# Patient Record
Sex: Male | Born: 1970 | Race: White | Hispanic: No | Marital: Single | Smoking: Current every day smoker
Health system: Southern US, Community
[De-identification: ages and names within clinical notes are randomized; demographics above are authoritative.]

## PROBLEM LIST (undated history)

## (undated) DIAGNOSIS — J45909 Unspecified asthma, uncomplicated: Secondary | ICD-10-CM

## (undated) DIAGNOSIS — M549 Dorsalgia, unspecified: Secondary | ICD-10-CM

## (undated) DIAGNOSIS — G8929 Other chronic pain: Secondary | ICD-10-CM

## (undated) DIAGNOSIS — F1120 Opioid dependence, uncomplicated: Secondary | ICD-10-CM

## (undated) HISTORY — PX: HAND SURGERY: SHX662

---

## 1998-05-06 ENCOUNTER — Emergency Department (HOSPITAL_COMMUNITY): Admission: EM | Admit: 1998-05-06 | Discharge: 1998-05-06 | Payer: Self-pay | Admitting: Emergency Medicine

## 1998-11-22 ENCOUNTER — Emergency Department (HOSPITAL_COMMUNITY): Admission: EM | Admit: 1998-11-22 | Discharge: 1998-11-22 | Payer: Self-pay | Admitting: Emergency Medicine

## 1998-11-22 ENCOUNTER — Encounter: Payer: Self-pay | Admitting: Emergency Medicine

## 2001-07-04 ENCOUNTER — Emergency Department (HOSPITAL_COMMUNITY): Admission: EM | Admit: 2001-07-04 | Discharge: 2001-07-04 | Payer: Self-pay

## 2001-07-04 ENCOUNTER — Encounter: Payer: Self-pay | Admitting: Emergency Medicine

## 2003-12-22 ENCOUNTER — Emergency Department (HOSPITAL_COMMUNITY): Admission: EM | Admit: 2003-12-22 | Discharge: 2003-12-22 | Payer: Self-pay | Admitting: Emergency Medicine

## 2005-12-13 ENCOUNTER — Emergency Department (HOSPITAL_COMMUNITY): Admission: EM | Admit: 2005-12-13 | Discharge: 2005-12-13 | Payer: Self-pay | Admitting: Emergency Medicine

## 2005-12-15 ENCOUNTER — Emergency Department (HOSPITAL_COMMUNITY): Admission: EM | Admit: 2005-12-15 | Discharge: 2005-12-15 | Payer: Self-pay | Admitting: Emergency Medicine

## 2005-12-18 ENCOUNTER — Emergency Department (HOSPITAL_COMMUNITY): Admission: EM | Admit: 2005-12-18 | Discharge: 2005-12-18 | Payer: Self-pay | Admitting: Emergency Medicine

## 2005-12-19 ENCOUNTER — Emergency Department (HOSPITAL_COMMUNITY): Admission: EM | Admit: 2005-12-19 | Discharge: 2005-12-19 | Payer: Self-pay | Admitting: Family Medicine

## 2005-12-21 ENCOUNTER — Emergency Department (HOSPITAL_COMMUNITY): Admission: EM | Admit: 2005-12-21 | Discharge: 2005-12-21 | Payer: Self-pay | Admitting: Emergency Medicine

## 2006-05-27 ENCOUNTER — Emergency Department (HOSPITAL_COMMUNITY): Admission: EM | Admit: 2006-05-27 | Discharge: 2006-05-27 | Payer: Self-pay | Admitting: Emergency Medicine

## 2006-05-30 ENCOUNTER — Ambulatory Visit (HOSPITAL_BASED_OUTPATIENT_CLINIC_OR_DEPARTMENT_OTHER): Admission: RE | Admit: 2006-05-30 | Discharge: 2006-05-30 | Payer: Self-pay | Admitting: *Deleted

## 2006-06-03 ENCOUNTER — Emergency Department (HOSPITAL_COMMUNITY): Admission: EM | Admit: 2006-06-03 | Discharge: 2006-06-03 | Payer: Self-pay | Admitting: Emergency Medicine

## 2006-11-24 ENCOUNTER — Emergency Department (HOSPITAL_COMMUNITY): Admission: EM | Admit: 2006-11-24 | Discharge: 2006-11-24 | Payer: Self-pay | Admitting: Emergency Medicine

## 2007-08-07 ENCOUNTER — Emergency Department (HOSPITAL_COMMUNITY): Admission: EM | Admit: 2007-08-07 | Discharge: 2007-08-07 | Payer: Self-pay | Admitting: Emergency Medicine

## 2008-03-13 ENCOUNTER — Emergency Department (HOSPITAL_COMMUNITY): Admission: EM | Admit: 2008-03-13 | Discharge: 2008-03-13 | Payer: Self-pay | Admitting: Emergency Medicine

## 2008-03-17 ENCOUNTER — Emergency Department (HOSPITAL_COMMUNITY): Admission: EM | Admit: 2008-03-17 | Discharge: 2008-03-17 | Payer: Self-pay | Admitting: Emergency Medicine

## 2009-01-03 ENCOUNTER — Emergency Department (HOSPITAL_COMMUNITY): Admission: EM | Admit: 2009-01-03 | Discharge: 2009-01-03 | Payer: Self-pay | Admitting: Emergency Medicine

## 2009-01-10 ENCOUNTER — Emergency Department (HOSPITAL_COMMUNITY): Admission: EM | Admit: 2009-01-10 | Discharge: 2009-01-10 | Payer: Self-pay | Admitting: Emergency Medicine

## 2009-04-28 ENCOUNTER — Emergency Department (HOSPITAL_COMMUNITY): Admission: EM | Admit: 2009-04-28 | Discharge: 2009-04-28 | Payer: Self-pay | Admitting: Emergency Medicine

## 2009-05-04 ENCOUNTER — Emergency Department (HOSPITAL_COMMUNITY): Admission: EM | Admit: 2009-05-04 | Discharge: 2009-05-04 | Payer: Self-pay | Admitting: Emergency Medicine

## 2009-05-18 ENCOUNTER — Emergency Department (HOSPITAL_COMMUNITY): Admission: EM | Admit: 2009-05-18 | Discharge: 2009-05-18 | Payer: Self-pay | Admitting: Emergency Medicine

## 2009-06-04 ENCOUNTER — Emergency Department (HOSPITAL_COMMUNITY): Admission: EM | Admit: 2009-06-04 | Discharge: 2009-06-04 | Payer: Self-pay | Admitting: Emergency Medicine

## 2009-08-15 ENCOUNTER — Emergency Department (HOSPITAL_COMMUNITY): Admission: EM | Admit: 2009-08-15 | Discharge: 2009-08-15 | Payer: Self-pay | Admitting: Emergency Medicine

## 2010-02-20 ENCOUNTER — Emergency Department (HOSPITAL_COMMUNITY): Admission: EM | Admit: 2010-02-20 | Discharge: 2010-02-20 | Payer: Self-pay | Admitting: Emergency Medicine

## 2010-03-31 ENCOUNTER — Emergency Department (HOSPITAL_COMMUNITY): Admission: EM | Admit: 2010-03-31 | Discharge: 2010-04-01 | Payer: Self-pay | Admitting: Emergency Medicine

## 2010-04-04 ENCOUNTER — Emergency Department (HOSPITAL_COMMUNITY): Admission: EM | Admit: 2010-04-04 | Discharge: 2010-04-04 | Payer: Self-pay | Admitting: Emergency Medicine

## 2010-05-02 ENCOUNTER — Emergency Department (HOSPITAL_COMMUNITY)
Admission: EM | Admit: 2010-05-02 | Discharge: 2010-05-02 | Payer: Self-pay | Source: Home / Self Care | Admitting: Emergency Medicine

## 2010-06-21 ENCOUNTER — Emergency Department (HOSPITAL_COMMUNITY)
Admission: EM | Admit: 2010-06-21 | Discharge: 2010-06-21 | Disposition: A | Discharge status: Home / Self Care | Payer: Self-pay | Attending: Emergency Medicine | Admitting: Emergency Medicine

## 2010-06-21 DIAGNOSIS — W230XXA Caught, crushed, jammed, or pinched between moving objects, initial encounter: Secondary | ICD-10-CM | POA: Insufficient documentation

## 2010-06-21 DIAGNOSIS — M79609 Pain in unspecified limb: Secondary | ICD-10-CM | POA: Insufficient documentation

## 2010-06-21 DIAGNOSIS — IMO0002 Reserved for concepts with insufficient information to code with codable children: Secondary | ICD-10-CM | POA: Insufficient documentation

## 2010-06-22 ENCOUNTER — Other Ambulatory Visit (HOSPITAL_COMMUNITY): Payer: Self-pay | Admitting: Emergency Medicine

## 2010-06-22 ENCOUNTER — Ambulatory Visit (HOSPITAL_COMMUNITY)
Admission: RE | Admit: 2010-06-22 | Discharge: 2010-06-22 | Disposition: A | Discharge status: Home / Self Care | Payer: Self-pay | Source: Ambulatory Visit | Attending: Emergency Medicine | Admitting: Emergency Medicine

## 2010-06-22 DIAGNOSIS — R52 Pain, unspecified: Secondary | ICD-10-CM | POA: Insufficient documentation

## 2010-08-22 ENCOUNTER — Emergency Department (HOSPITAL_COMMUNITY)
Admission: EM | Admit: 2010-08-22 | Discharge: 2010-08-22 | Disposition: A | Discharge status: Home / Self Care | Payer: Worker's Compensation | Attending: Emergency Medicine | Admitting: Emergency Medicine

## 2010-08-22 ENCOUNTER — Emergency Department (HOSPITAL_COMMUNITY): Payer: Worker's Compensation

## 2010-08-22 DIAGNOSIS — Y929 Unspecified place or not applicable: Secondary | ICD-10-CM | POA: Insufficient documentation

## 2010-08-22 DIAGNOSIS — S8010XA Contusion of unspecified lower leg, initial encounter: Secondary | ICD-10-CM | POA: Insufficient documentation

## 2010-08-22 DIAGNOSIS — W12XXXA Fall on and from scaffolding, initial encounter: Secondary | ICD-10-CM | POA: Insufficient documentation

## 2010-08-22 DIAGNOSIS — M25569 Pain in unspecified knee: Secondary | ICD-10-CM | POA: Insufficient documentation

## 2010-08-22 DIAGNOSIS — IMO0002 Reserved for concepts with insufficient information to code with codable children: Secondary | ICD-10-CM | POA: Insufficient documentation

## 2010-08-22 DIAGNOSIS — M79609 Pain in unspecified limb: Secondary | ICD-10-CM | POA: Insufficient documentation

## 2010-08-22 DIAGNOSIS — Z7982 Long term (current) use of aspirin: Secondary | ICD-10-CM | POA: Insufficient documentation

## 2010-10-07 NOTE — Op Note (Signed)
NAMEVERLAN, GROTZ            ACCOUNT NO.:  [id number]   MEDICAL RECORD NO.:  [id number]          PATIENT TYPE:  AMB   LOCATION:  DSC                          FACILITY:  [facility]   PHYSICIAN:  Tennis Must Meyerdierks, M.D.DATE OF BIRTH:  [birth year]   DATE OF PROCEDURE:  05/30/2006  DATE OF DISCHARGE:                               OPERATIVE REPORT   PREOPERATIVE DIAGNOSIS:  Proximal phalanx fracture, right small finger.   POSTOPERATIVE DIAGNOSIS:  Proximal phalanx fracture, right small finger.   PROCEDURE:  Open reduction internal fixation proximal phalanx, right  small finger.   SURGEON:  Lowell Bouton, M.D.   ANESTHESIA:  General.   OPERATIVE FINDINGS:  The patient had a comminuted oblique fracture of  the proximal phalanx that was ulnarly deviated.   PROCEDURE:  Under general anesthesia with a tourniquet on the right arm,  the right hand was prepped and draped in usual fashion.  After  exsanguinating the limb the tourniquet was inflated to 250 mmHg.  A  longitudinal incision was made over the dorsum of the proximal phalanx  and carried through the subcutaneous tissues.  Bleeding points were  coagulated.  The extensor tendon was then divided longitudinally and  Freer elevator was used to identify the fracture site.  The fracture was  cleaned with a curette and irrigated and reduced.  It was held  temporarily with a 0.035 K-wire from dorsal to volar.  Rotation appeared  normal clinically.  A five hole plate was then applied from the modular  handset using a 1.5 mm plate.  The distal hole was cut off because of  the proximity to the PIP joint.  The plate was then inserted using 1.5-  mm screws and a 1.3-mm screw.  X-rays showed good position of the  fracture and good length of the screws.  The wound was then irrigated  copiously with saline.  The extensor mechanism was repaired with a 4-0  Mersilene the skin with a 3-0 subcuticular Prolene.  Steri-Strips were  applied.  Half percent Marcaine digital block was performed.  The  patient was placed in a dorsal protective splint.  He tolerated the  procedure well and a tourniquet was released with good circulation of  the hand.  He went to the recovery room awake in stable condition.      Lowell Bouton, M.D.  Electronically Signed     EMM/MEDQ  D:  05/30/2006  T:  05/30/2006  Job:  [id number]

## 2010-12-14 ENCOUNTER — Emergency Department (HOSPITAL_COMMUNITY): Payer: Self-pay

## 2010-12-14 ENCOUNTER — Emergency Department (HOSPITAL_COMMUNITY)
Admission: EM | Admit: 2010-12-14 | Discharge: 2010-12-14 | Disposition: A | Discharge status: Home / Self Care | Payer: Self-pay | Attending: Emergency Medicine | Admitting: Emergency Medicine

## 2010-12-14 DIAGNOSIS — S6990XA Unspecified injury of unspecified wrist, hand and finger(s), initial encounter: Secondary | ICD-10-CM | POA: Insufficient documentation

## 2010-12-14 DIAGNOSIS — Z981 Arthrodesis status: Secondary | ICD-10-CM | POA: Insufficient documentation

## 2010-12-14 DIAGNOSIS — W230XXA Caught, crushed, jammed, or pinched between moving objects, initial encounter: Secondary | ICD-10-CM | POA: Insufficient documentation

## 2010-12-14 DIAGNOSIS — S6980XA Other specified injuries of unspecified wrist, hand and finger(s), initial encounter: Secondary | ICD-10-CM | POA: Insufficient documentation

## 2010-12-14 DIAGNOSIS — M79609 Pain in unspecified limb: Secondary | ICD-10-CM | POA: Insufficient documentation

## 2011-03-23 IMAGING — CR DG KNEE COMPLETE 4+V*L*
5 series · 5 of 5 positions shown · non-contrast
Comparison: None.

CLINICAL DATA: Left knee pain.

LEFT KNEE - COMPLETE 4+ VIEW

[t knee ap left]
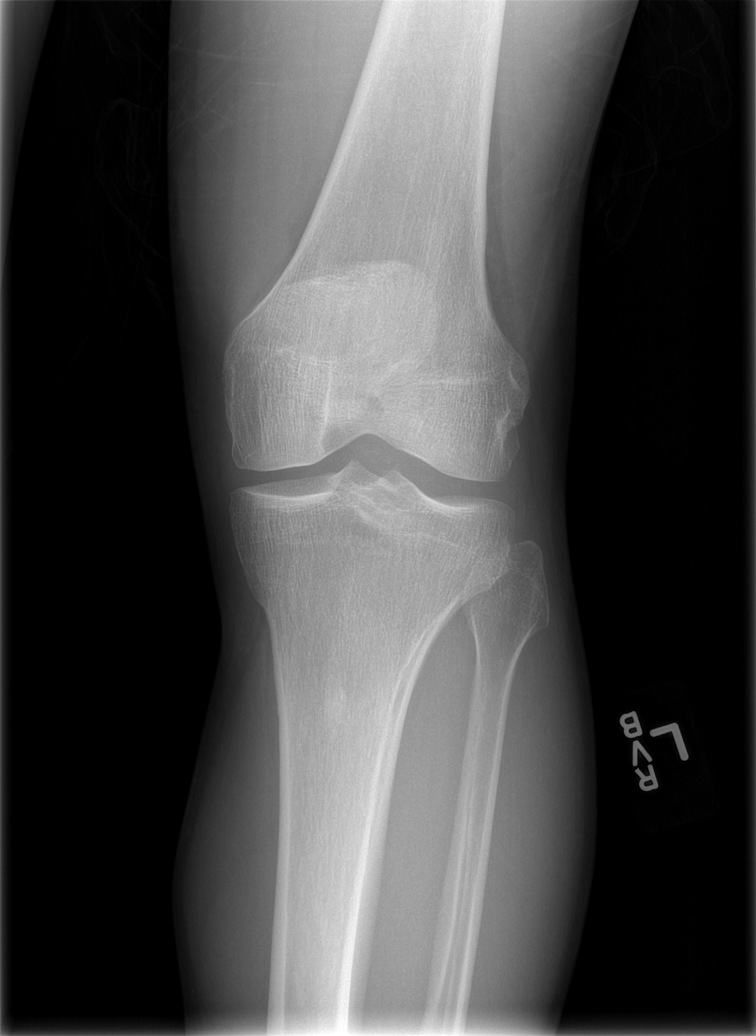

[t knee oblique left (1 of 2)]
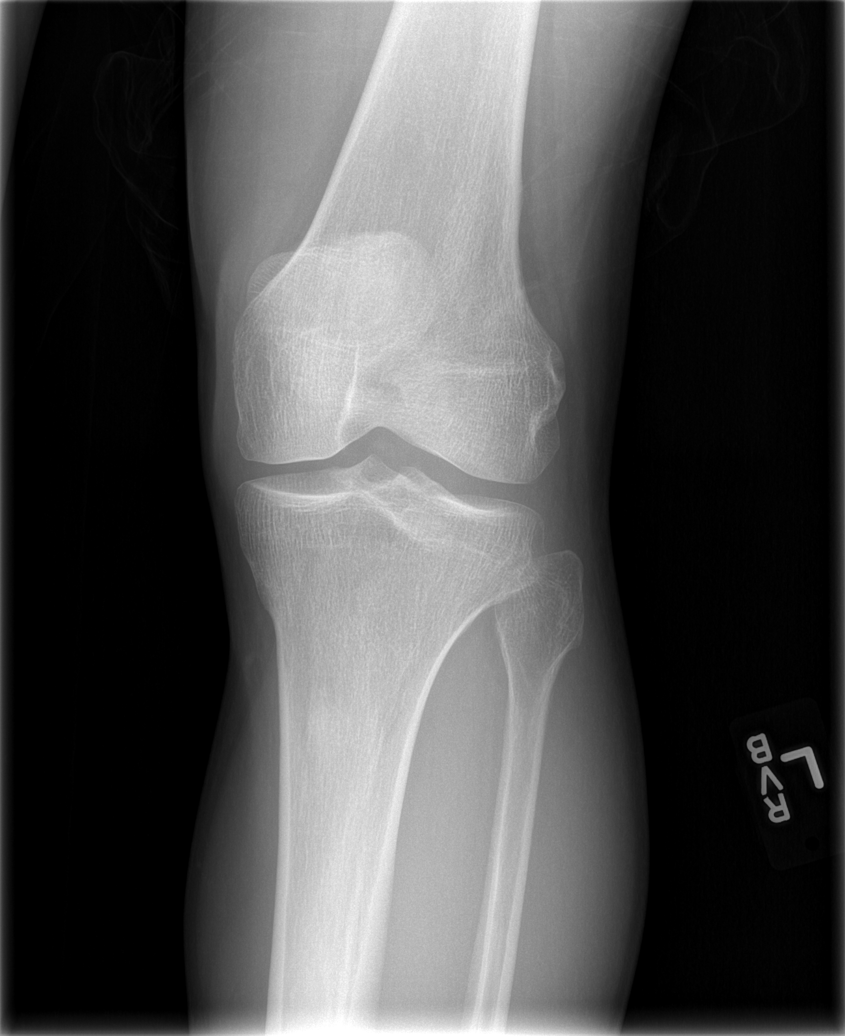

[t knee oblique left (2 of 2)]
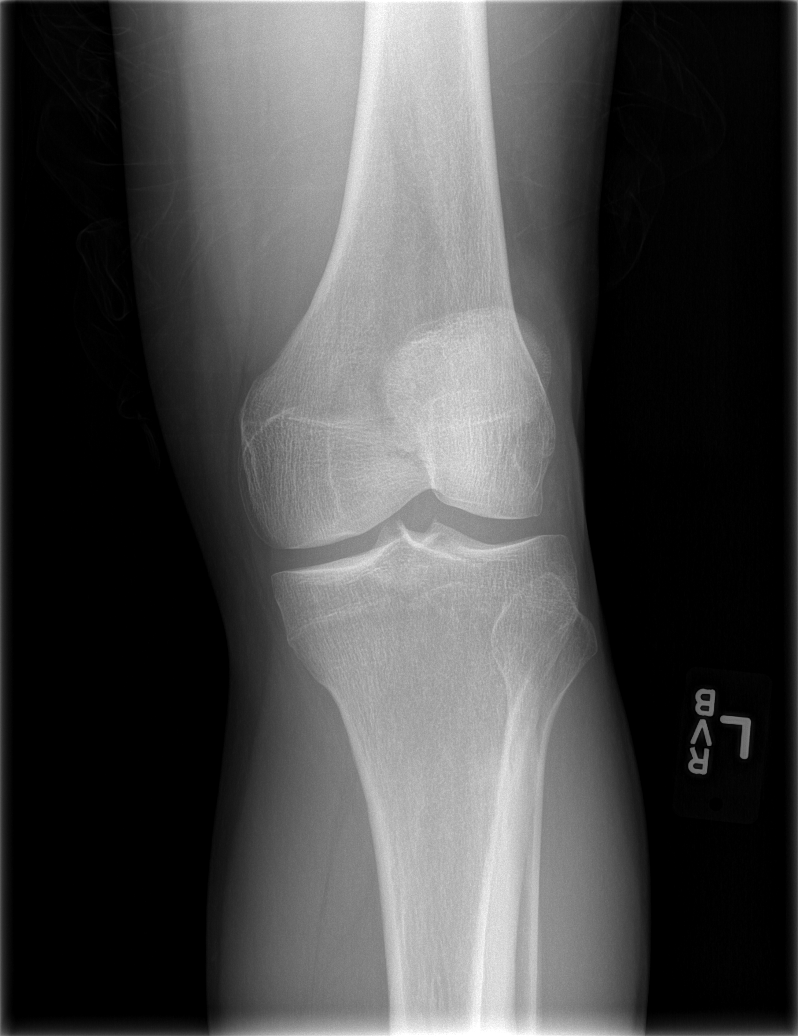

[t knee lat left (1 of 2)]
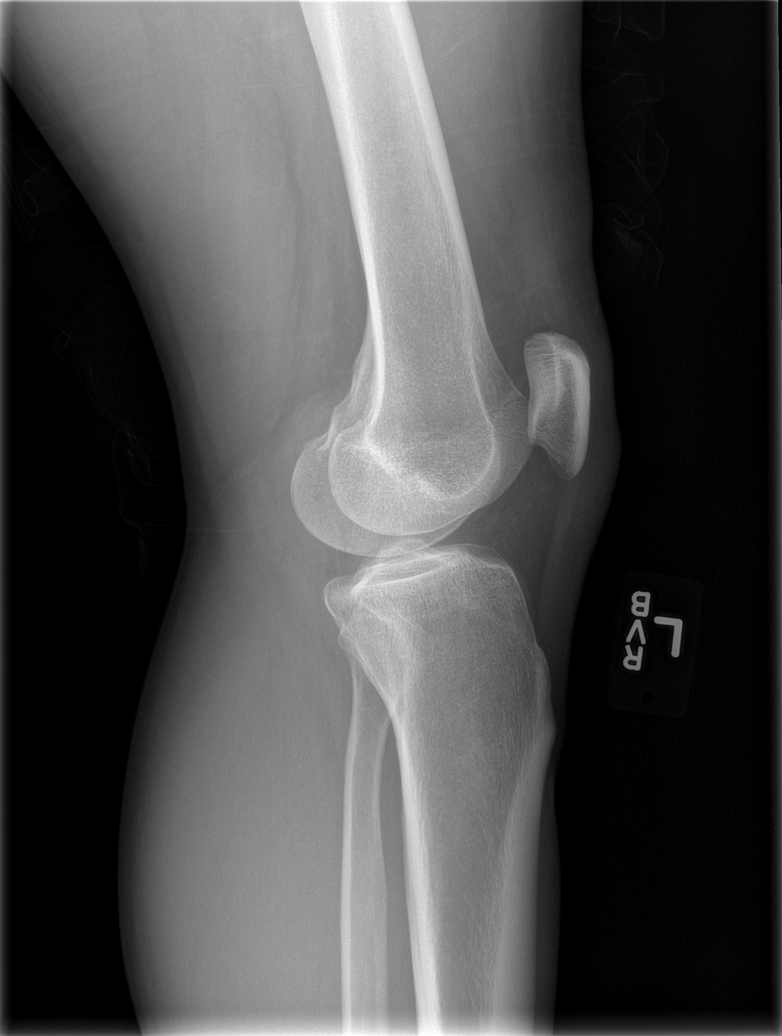

[t knee lat left (2 of 2)]
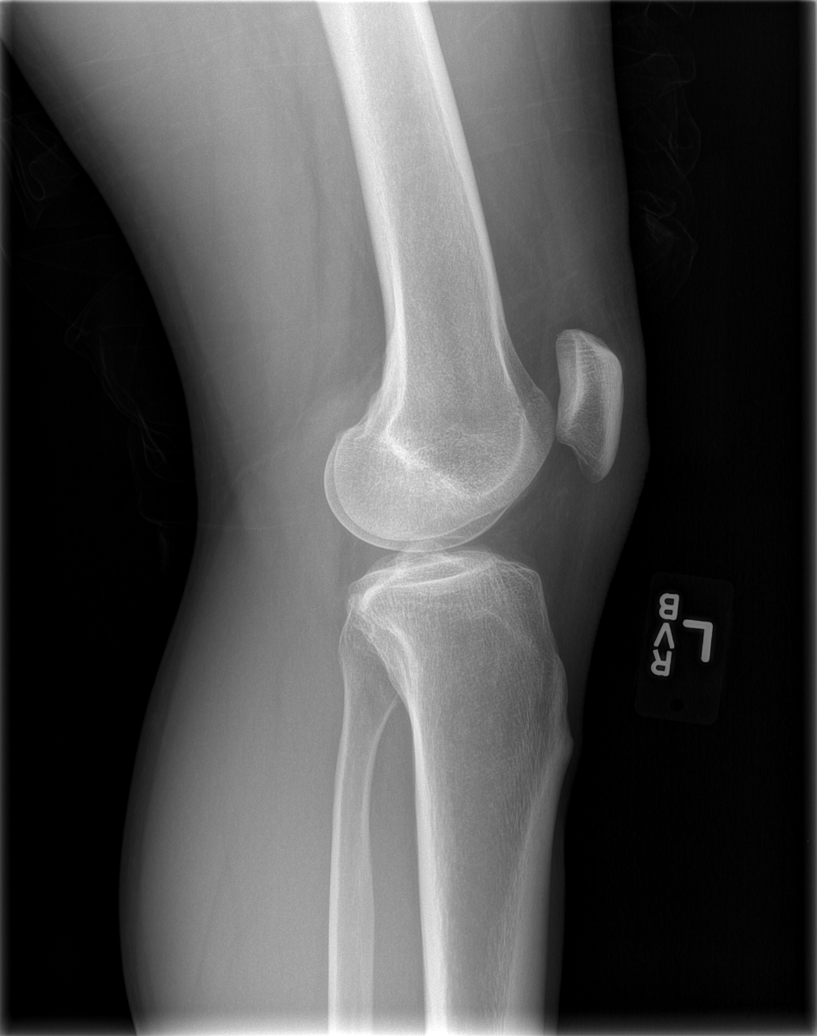

[5 of 5 positions shown; findings below may reference images not displayed]

FINDINGS: Imaged bones, joints and soft tissues appear normal.
IMPRESSION: Negative exam.

## 2011-05-27 ENCOUNTER — Emergency Department (HOSPITAL_COMMUNITY)
Admission: EM | Admit: 2011-05-27 | Discharge: 2011-05-27 | Disposition: A | Discharge status: Home / Self Care | Payer: Self-pay | Attending: Emergency Medicine | Admitting: Emergency Medicine

## 2011-05-27 DIAGNOSIS — L600 Ingrowing nail: Secondary | ICD-10-CM | POA: Insufficient documentation

## 2011-05-27 DIAGNOSIS — B351 Tinea unguium: Secondary | ICD-10-CM | POA: Insufficient documentation

## 2011-05-27 DIAGNOSIS — F17200 Nicotine dependence, unspecified, uncomplicated: Secondary | ICD-10-CM | POA: Insufficient documentation

## 2011-05-27 DIAGNOSIS — M7989 Other specified soft tissue disorders: Secondary | ICD-10-CM | POA: Insufficient documentation

## 2011-05-27 DIAGNOSIS — M79609 Pain in unspecified limb: Secondary | ICD-10-CM | POA: Insufficient documentation

## 2011-05-27 MED ORDER — BUPIVACAINE HCL (PF) 0.5 % IJ SOLN
10.0000 mL | Freq: Once | INTRAMUSCULAR | Status: DC
  Filled 2011-05-27: qty 10

## 2011-05-27 MED ORDER — CEPHALEXIN 500 MG PO CAPS: 500.0000 mg | ORAL_CAPSULE | Freq: Four times a day (QID) | ORAL | Status: AC

## 2011-05-27 MED ORDER — OXYCODONE-ACETAMINOPHEN 5-325 MG PO TABS: 1.0000 | ORAL_TABLET | ORAL | Status: AC | PRN

## 2011-05-27 NOTE — ED Provider Notes (Signed)
Medical screening examination/treatment/procedure(s) were performed by non-physician practitioner and as supervising physician I was immediately available for consultation/collaboration.  Donnetta Hutching, MD 05/27/11 2308

## 2011-05-27 NOTE — ED Notes (Signed)
Lt. Great toe is infected

## 2011-05-27 NOTE — ED Provider Notes (Signed)
History     CSN: [id number]  Arrival date & time 05/27/11  1246   First MD Initiated Contact with Patient 05/27/11 1550      Chief Complaint  Patient presents with  . Toe Pain    (Consider location/radiation/quality/duration/timing/severity/associated sxs/prior treatment) Patient is a 41 y.o. male presenting with toe pain. The history is provided by the patient.  Toe Pain This is a new problem. The current episode started in the past 7 days. The problem occurs constantly. The problem has been gradually worsening. Pertinent negatives include no chills, fever or weakness. The symptoms are aggravated by standing, stress and walking. He has tried nothing for the symptoms.  Pt states he thinks he has an ingrown toe nail. Hx of the same before.States had to have part of his nail removed in the past. Stats also has hardening and yellowing of that nail. Denies fever, chills,malaise, redness of the toe, drainage, injury.  History reviewed. No pertinent past medical history.  History reviewed. No pertinent past surgical history.  No family history on file.  History  Substance Use Topics  . Smoking status: Current Everyday Smoker -- 0.5 packs/day    Types: Cigarettes  . Smokeless tobacco: Never Used  . Alcohol Use: No      Review of Systems  Constitutional: Negative for fever and chills.  HENT: Negative.   Eyes: Negative.   Respiratory: Negative.   Cardiovascular: Negative.   Gastrointestinal: Negative.   Genitourinary: Negative.   Musculoskeletal:       Toe pain  Skin: Negative.   Neurological: Negative.  Negative for weakness.  Psychiatric/Behavioral: Negative.     Allergies  Hydrocodone  Home Medications  No current outpatient prescriptions on file.  BP 116/77  Pulse 66  Temp(Src) 98.1 F (36.7 C) (Oral)  Resp 20  Ht 5\' 11"  (1.803 m)  Wt 196 lb (88.905 kg)  BMI 27.34 kg/m2  SpO2 98%  Physical Exam  Nursing note and vitals reviewed. Constitutional: He is  oriented to person, place, and time. He appears well-developed and well-nourished. No distress.  HENT:  Head: Normocephalic and atraumatic.  Eyes: Conjunctivae are normal.  Neck: Neck supple.  Cardiovascular: Normal rate, regular rhythm and normal heart sounds.   Pulmonary/Chest: Effort normal and breath sounds normal. No respiratory distress.  Musculoskeletal: Normal range of motion. He exhibits tenderness.       Left great toe ingrown to nail. Tissue surrounding left nail swollen, tender. There is onychomycosis of the toe nail  Neurological: He is alert and oriented to person, place, and time.  Skin: Skin is warm and dry.  Psychiatric: He has a normal mood and affect.    ED Course  Excise ingrown toenail Date/Time: 05/27/2011 4:51 PM Performed by: Jaynie Crumble A Authorized by: Jaynie Crumble A Consent: Verbal consent obtained. Written consent not obtained. Consent given by: patient Patient understanding: patient states understanding of the procedure being performed Patient consent: the patient's understanding of the procedure matches consent given Procedure consent: procedure consent matches procedure scheduled Required items: required blood products, implants, devices, and special equipment available Patient identity confirmed: verbally with patient and arm band Time out: Immediately prior to procedure a "time out" was called to verify the correct patient, procedure, equipment, support staff and site/side marked as required. Preparation: Patient was prepped and draped in the usual sterile fashion. Local anesthesia used: yes Anesthesia: digital block Local anesthetic: bupivacaine 0.5% with epinephrine Anesthetic total: 4 ml Patient sedated: no Patient tolerance: Patient tolerated the procedure  well with no immediate complications.   (including critical care time)  Half of the nail that is ingrown excised. Will d/c home with podiatry follow up.   MDM          Lottie Mussel, PA 05/27/11 1653  Lottie Mussel, PA 05/27/11 1655

## 2012-02-21 ENCOUNTER — Emergency Department (HOSPITAL_COMMUNITY): Payer: Self-pay

## 2012-02-21 ENCOUNTER — Emergency Department (HOSPITAL_COMMUNITY)
Admission: EM | Admit: 2012-02-21 | Discharge: 2012-02-21 | Disposition: A | Discharge status: Home / Self Care | Payer: Self-pay | Attending: Emergency Medicine | Admitting: Emergency Medicine

## 2012-02-21 ENCOUNTER — Encounter (HOSPITAL_COMMUNITY): Payer: Self-pay | Admitting: Emergency Medicine

## 2012-02-21 DIAGNOSIS — M79643 Pain in unspecified hand: Secondary | ICD-10-CM

## 2012-02-21 DIAGNOSIS — S6990XA Unspecified injury of unspecified wrist, hand and finger(s), initial encounter: Secondary | ICD-10-CM | POA: Insufficient documentation

## 2012-02-21 DIAGNOSIS — S62309A Unspecified fracture of unspecified metacarpal bone, initial encounter for closed fracture: Secondary | ICD-10-CM | POA: Insufficient documentation

## 2012-02-21 DIAGNOSIS — W208XXA Other cause of strike by thrown, projected or falling object, initial encounter: Secondary | ICD-10-CM | POA: Insufficient documentation

## 2012-02-21 DIAGNOSIS — S62339A Displaced fracture of neck of unspecified metacarpal bone, initial encounter for closed fracture: Secondary | ICD-10-CM

## 2012-02-21 HISTORY — DX: Unspecified asthma, uncomplicated: J45.909

## 2012-02-21 MED ORDER — OXYCODONE-ACETAMINOPHEN 5-325 MG PO TABS: 2.0000 | ORAL_TABLET | ORAL | Status: DC | PRN

## 2012-02-21 MED ORDER — OXYCODONE-ACETAMINOPHEN 5-325 MG PO TABS
2.0000 | ORAL_TABLET | Freq: Once | ORAL | Status: AC
  Administered 2012-02-21: 2 via ORAL
  Filled 2012-02-21: qty 2
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
## 2012-02-21 NOTE — ED Provider Notes (Signed)
Medical screening examination/treatment/procedure(s) were performed by non-physician practitioner and as supervising physician I was immediately available for consultation/collaboration.   Benny Lennert, MD 02/21/12 1346

## 2012-02-21 NOTE — ED Notes (Signed)
Pt reports blunt trauma to r/hand. Outer aspect of hand swollen , with decreased ROM. Hx of injury to same area

## 2012-02-21 NOTE — ED Provider Notes (Signed)
History     CSN: [id number]  Arrival date & time 02/21/12  1014   First MD Initiated Contact with Patient 02/21/12 1033      Chief Complaint  Patient presents with  . Hand Injury    r/hand swollen 18 hrs post trauma    (Consider location/radiation/quality/duration/timing/severity/associated sxs/prior treatment) The history is provided by the patient and medical records.    Jesus Glass is a 41 y.o. male presents to the emergency department complaining of right hand pain.  The onset of the symptoms was  abrupt starting 16 hours ago.  The patient has associated swelling and ecchymosis.  The symptoms have been  persistent, gradually worsened.  Nothing makes the symptoms worse and nothing makes symptoms better.  The patient denies fever, chills, headache, neck pain, back pain, chest pain, shortness of breath, abdominal pain, nausea, vomiting, diarrhea, weakness, dizziness, loss of sensation.  Patient states he was moving a 500 lb door when it dropped on his right hand causing injury.  Patient states the pain, swelling and bruising have increased throughout the night. Patient states he's been taking Goody powders without pain relief.  Patient has a history of fracture on the right hand to the proximal phalanx of the pinky finger which was surgically repaired 2 years ago.   Past Medical History  Diagnosis Date  . Asthma     Past Surgical History  Procedure Date  . Hand surgery     No family history on file.  History  Substance Use Topics  . Smoking status: Current Every Day Smoker -- 0.5 packs/day    Types: Cigarettes  . Smokeless tobacco: Never Used  . Alcohol Use: No      Review of Systems  Musculoskeletal: Positive for joint swelling and arthralgias. Negative for gait problem.  Skin: Negative for wound.  Neurological: Negative for numbness.  All other systems reviewed and are negative.    Allergies  Hydrocodone  Home Medications   Current Outpatient Rx    Name Route Sig Dispense Refill  . OXYCODONE-ACETAMINOPHEN 5-325 MG PO TABS Oral Take 2 tablets by mouth every 4 (four) hours as needed for pain. 12 tablet 0    BP 119/87  Pulse 99  Temp 98.5 F (36.9 C) (Oral)  Resp 18  Wt 185 lb (83.915 kg)  SpO2 99%  Physical Exam  Nursing note and vitals reviewed. Constitutional: He appears well-developed and well-nourished. No distress.  HENT:  Head: Normocephalic and atraumatic.  Eyes: Conjunctivae normal are normal.  Cardiovascular: Normal rate, regular rhythm, normal heart sounds and intact distal pulses.  Exam reveals no gallop and no friction rub.   No murmur heard.      Capillary refill less than 3 seconds  Pulmonary/Chest: Effort normal and breath sounds normal.  Musculoskeletal: He exhibits edema and tenderness.       ROM: Decreased in the right pinky and fourth fingers. Strong 5/5 grip strength with the first 3 digits of the right hand. Pain to palpation the fifth metacarpal  Neurological: He is alert. Coordination normal.       Sensation intact to sharp and dull touch in the first through fourth digits; patient states touch in the pinky finger feels tingly but he is able to feel it. Strength 5/5 in the first 3 digits of the right hand; decreased in the ring and pinky fingers.  Skin: Skin is warm and dry. He is not diaphoretic.       Ecchymosis and breathing on the medial  aspect of the dorsal aspect of the hand.    ED Course  Procedures (including critical care time)  Labs Reviewed - No data to display Dg Hand Complete Right  02/21/2012  *RADIOLOGY REPORT*  Clinical Data: Hand injury.  Fall.  RIGHT HAND - COMPLETE 3+ VIEW  Comparison: 12/14/2010  Findings: Postoperative changes again noted within the proximal fifth phalanx.  There is a fracture through the distal aspect of the right fifth metacarpal with mild angulation.  Overlying soft tissue swelling.  IMPRESSION: Distal right fifth metacarpal fracture.   Original Report  Authenticated By: Cyndie Chime, M.D.      1. Boxers fracture   2. Hand pain       MDM  [name] presents emergency department with right hand pain.  Concern for boxer's fracture.  X-ray with distal right fifth metacarpal fracture.  Patient splinted in the emergency department and pain controled.  I will have the patient followup with hand surgery today or tomorrow.  Discussed these findings with the patient.  I have also discussed reasons to return immediately to the ER.  Patient expresses understanding and agrees with plan.  1. Medications: Percocet 2. Treatment: Rest, ice, compression, elevation, Motrin/ibuprofen for pain control, Percocet for breakthrough pain 3. Follow Up: With hand surgery this afternoon or tomorrow.         Dahlia Client Kodi Steil, PA-C 02/21/12 1250

## 2012-03-02 ENCOUNTER — Encounter (HOSPITAL_COMMUNITY): Payer: Self-pay | Admitting: Emergency Medicine

## 2012-03-02 ENCOUNTER — Emergency Department (HOSPITAL_COMMUNITY)
Admission: EM | Admit: 2012-03-02 | Discharge: 2012-03-02 | Disposition: A | Discharge status: Home / Self Care | Payer: Self-pay | Attending: Emergency Medicine | Admitting: Emergency Medicine

## 2012-03-02 ENCOUNTER — Emergency Department (HOSPITAL_COMMUNITY): Payer: Self-pay

## 2012-03-02 DIAGNOSIS — M79609 Pain in unspecified limb: Secondary | ICD-10-CM | POA: Insufficient documentation

## 2012-03-02 DIAGNOSIS — F17200 Nicotine dependence, unspecified, uncomplicated: Secondary | ICD-10-CM | POA: Insufficient documentation

## 2012-03-02 DIAGNOSIS — J45909 Unspecified asthma, uncomplicated: Secondary | ICD-10-CM | POA: Insufficient documentation

## 2012-03-02 DIAGNOSIS — S62339A Displaced fracture of neck of unspecified metacarpal bone, initial encounter for closed fracture: Secondary | ICD-10-CM

## 2012-03-02 DIAGNOSIS — IMO0001 Reserved for inherently not codable concepts without codable children: Secondary | ICD-10-CM | POA: Insufficient documentation

## 2012-03-02 MED ORDER — MELOXICAM 15 MG PO TABS: 15.0000 mg | ORAL_TABLET | Freq: Every day | ORAL | Status: DC
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
## 2012-03-02 NOTE — ED Notes (Signed)
Pt dc's w/ written instructions, pt verbalized understanding

## 2012-03-02 NOTE — ED Provider Notes (Signed)
Medical screening examination/treatment/procedure(s) were performed by non-physician practitioner and as supervising physician I was immediately available for consultation/collaboration.  John-Adam Margarethe Virgen, M.D.     John-Adam Mahaila Tischer, MD 03/02/12 2310 

## 2012-03-02 NOTE — ED Notes (Signed)
Pt seen here Oct 2 w/ dx of boxer's fractor to right hand. Pt indicates he was discharged w/ f/u to Dr. Lajoyce Corners who was to operate on pt.  Pt has not had surgery, the device provided pt in ortho's office is to small for the pt's hand so pt has continued to wear wrist immbolizer. Pt presents w/ new worsening pain in right hand, worked yesterday and thinks he may have re-injured his hand yesterday.

## 2012-03-02 NOTE — Discharge Instructions (Signed)
Elevate your hand. Avoid using it.  Keep the brace on. Ice. Follow up with Dr. Lajoyce Corners for further evaluation and treatment. Mobic for pain and inflammation.   Boxer's Fracture You have a break (fracture) of the fifth metacarpal bone. This is commonly called a boxer's fracture. This is the bone in the hand where the little finger attaches. The fracture is in the end of that bone, closest to the little finger. It is usually caused when you hit an object with a clenched fist. Often, the knuckle is pushed down by the impact. Sometimes, the fracture rotates out of position. A boxer's fracture will usually heal within 6 weeks, if it is treated properly and protected from re-injury. Surgery is sometimes needed. A cast, splint, or bulky hand dressing may be used to protect and immobilize a boxer's fracture. Do not remove this device or dressing until your caregiver approves. Keep your hand elevated, and apply ice packs for 15 to 20 minutes every 2 hours, for the first 2 days. Elevation and ice help reduce swelling and relieve pain. See your caregiver, or an orthopedic specialist, for follow-up care within the next 10 days. This is to make sure your fracture is healing properly. Document Released: 05/08/2005 Document Revised: 07/31/2011 Document Reviewed: 10/26/2006 [facility] Patient Information 2013 [place], [place].

## 2012-05-09 ENCOUNTER — Emergency Department (HOSPITAL_COMMUNITY): Payer: Self-pay

## 2012-05-09 ENCOUNTER — Encounter (HOSPITAL_COMMUNITY): Payer: Self-pay | Admitting: Emergency Medicine

## 2012-05-09 ENCOUNTER — Emergency Department (HOSPITAL_COMMUNITY)
Admission: EM | Admit: 2012-05-09 | Discharge: 2012-05-09 | Disposition: A | Discharge status: Home / Self Care | Payer: Self-pay | Attending: Emergency Medicine | Admitting: Emergency Medicine

## 2012-05-09 DIAGNOSIS — F17200 Nicotine dependence, unspecified, uncomplicated: Secondary | ICD-10-CM | POA: Insufficient documentation

## 2012-05-09 DIAGNOSIS — X58XXXA Exposure to other specified factors, initial encounter: Secondary | ICD-10-CM | POA: Insufficient documentation

## 2012-05-09 DIAGNOSIS — Z23 Encounter for immunization: Secondary | ICD-10-CM | POA: Insufficient documentation

## 2012-05-09 DIAGNOSIS — S6990XA Unspecified injury of unspecified wrist, hand and finger(s), initial encounter: Secondary | ICD-10-CM | POA: Insufficient documentation

## 2012-05-09 DIAGNOSIS — S6991XA Unspecified injury of right wrist, hand and finger(s), initial encounter: Secondary | ICD-10-CM

## 2012-05-09 DIAGNOSIS — Y9389 Activity, other specified: Secondary | ICD-10-CM | POA: Insufficient documentation

## 2012-05-09 DIAGNOSIS — Y9289 Other specified places as the place of occurrence of the external cause: Secondary | ICD-10-CM | POA: Insufficient documentation

## 2012-05-09 DIAGNOSIS — J45909 Unspecified asthma, uncomplicated: Secondary | ICD-10-CM | POA: Insufficient documentation

## 2012-05-09 MED ORDER — CEPHALEXIN 500 MG PO CAPS: 500.0000 mg | ORAL_CAPSULE | Freq: Four times a day (QID) | ORAL | Status: DC

## 2012-05-09 MED ORDER — OXYCODONE-ACETAMINOPHEN 5-325 MG PO TABS
1.0000 | ORAL_TABLET | Freq: Once | ORAL | Status: AC
  Administered 2012-05-09: 1 via ORAL
  Filled 2012-05-09: qty 1

## 2012-05-09 MED ORDER — OXYCODONE-ACETAMINOPHEN 5-325 MG PO TABS: 1.0000 | ORAL_TABLET | ORAL | Status: DC | PRN

## 2012-05-09 MED ORDER — TETANUS-DIPHTH-ACELL PERTUSSIS 5-2.5-18.5 LF-MCG/0.5 IM SUSP
0.5000 mL | Freq: Once | INTRAMUSCULAR | Status: AC
  Administered 2012-05-09: 0.5 mL via INTRAMUSCULAR
  Filled 2012-05-09: qty 0.5

## 2012-05-09 NOTE — ED Provider Notes (Signed)
Medical screening examination/treatment/procedure(s) were performed by non-physician practitioner and as supervising physician I was immediately available for consultation/collaboration.  Flint Melter, MD 05/09/12 [phone]

## 2012-05-09 NOTE — Progress Notes (Signed)
On 05/09/12 during [facility] ED visit pt was noted to be listed with no insurance coverage CM and [facility] liaison spoke with pt.  Pt offered services to assist with finding a [place] self pay provider & health reform information  

## 2012-05-09 NOTE — ED Provider Notes (Signed)
History     CSN: [id number]  Arrival date & time 05/09/12  1158   First MD Initiated Contact with Patient 05/09/12 1239      Chief Complaint  Patient presents with  . Foreign Body in Skin    (Consider location/radiation/quality/duration/timing/severity/associated sxs/prior treatment) Patient is a 41 y.o. male presenting with hand injury. The history is provided by the patient.  Hand Injury  The incident occurred 3 to 5 hours ago. The incident occurred at work. The pain is present in the right hand. The pain is moderate. Pertinent negatives include no fever. Associated symptoms comments: Working with a wooden handled shovel while working this morning and a large splinter broke off in his right hand. He has had increasing pain since this morning along with moderate swelling..    Past Medical History  Diagnosis Date  . Asthma     Past Surgical History  Procedure Date  . Hand surgery     History reviewed. No pertinent family history.  History  Substance Use Topics  . Smoking status: Current Every Day Smoker -- 0.5 packs/day    Types: Cigarettes  . Smokeless tobacco: Never Used  . Alcohol Use: No      Review of Systems  Constitutional: Negative for fever and chills.  Musculoskeletal:       See HPI  Skin: Negative.   Neurological: Negative.  Negative for numbness.    Allergies  Hydrocodone  Home Medications   Current Outpatient Rx  Name  Route  Sig  Dispense  Refill  . ACETAMINOPHEN 500 MG PO TABS   Oral   Take 1,000 mg by mouth every 6 (six) hours as needed. Pain           BP 119/75  Pulse 84  Temp 98 F (36.7 C) (Oral)  Resp 18  SpO2 99%  Physical Exam  Constitutional: He is oriented to person, place, and time. He appears well-developed and well-nourished.  Neck: Normal range of motion.  Pulmonary/Chest: Effort normal.  Musculoskeletal: Normal range of motion.       Right moderately swollen at hypothenar surface. Puncture wound at thumb base  overlying palmar aspect 1st MCP. No active drainage or bleeding. ROM limited by discomfort. Full strength of first digit.   Neurological: He is alert and oriented to person, place, and time.  Skin: Skin is warm and dry.  Psychiatric: He has a normal mood and affect.    ED Course  Procedures (including critical care time)  Labs Reviewed - No data to display No results found.   No diagnosis found. 1. FB right hand   MDM  The patient has connection with Dr. Aldean Baker and makes an appointment for follow up prior to discharge. Abx started for FB, baseline x-ray taken. Pain addressed.        Rodena Medin, PA-C 05/09/12 1440

## 2012-05-09 NOTE — ED Notes (Signed)
Pt states he got a wood splinter in his right hand this am, pt tried to pull it out but some was left in hand. Pt now with pain and swelling to right palm.

## 2012-07-08 ENCOUNTER — Emergency Department (HOSPITAL_COMMUNITY)
Admission: EM | Admit: 2012-07-08 | Discharge: 2012-07-08 | Disposition: A | Discharge status: Home / Self Care | Payer: Self-pay | Attending: Emergency Medicine | Admitting: Emergency Medicine

## 2012-07-08 ENCOUNTER — Emergency Department (HOSPITAL_COMMUNITY): Payer: Self-pay

## 2012-07-08 ENCOUNTER — Encounter (HOSPITAL_COMMUNITY): Payer: Self-pay | Admitting: *Deleted

## 2012-07-08 DIAGNOSIS — F17200 Nicotine dependence, unspecified, uncomplicated: Secondary | ICD-10-CM | POA: Insufficient documentation

## 2012-07-08 DIAGNOSIS — Y929 Unspecified place or not applicable: Secondary | ICD-10-CM | POA: Insufficient documentation

## 2012-07-08 DIAGNOSIS — S9030XA Contusion of unspecified foot, initial encounter: Secondary | ICD-10-CM | POA: Insufficient documentation

## 2012-07-08 DIAGNOSIS — Y939 Activity, unspecified: Secondary | ICD-10-CM | POA: Insufficient documentation

## 2012-07-08 DIAGNOSIS — W208XXA Other cause of strike by thrown, projected or falling object, initial encounter: Secondary | ICD-10-CM | POA: Insufficient documentation

## 2012-07-08 DIAGNOSIS — J45901 Unspecified asthma with (acute) exacerbation: Secondary | ICD-10-CM | POA: Insufficient documentation

## 2012-07-08 NOTE — ED Notes (Signed)
Pt left ED before receiving discharge paper and signing out.

## 2012-07-08 NOTE — ED Notes (Signed)
Lt foot injury, dropped concrete board on foot 1 week ago

## 2012-07-08 NOTE — ED Provider Notes (Signed)
History     CSN: [id number]  Arrival date & time 07/08/12  1839   First MD Initiated Contact with Patient 07/08/12 2045      Chief Complaint  Patient presents with  . Foot Pain    (Consider location/radiation/quality/duration/timing/severity/associated sxs/prior treatment) HPI Comments: Patient reports having had a cement board to fall on  His left foot approximately one week ago. He states the bruising is slowly improving, but he continues to have pain and presents to the emergency department for evaluation and to rule out fracture.  Patient is a 42 y.o. male presenting with lower extremity pain. The history is provided by the patient.  Foot Pain This is a new problem. The current episode started in the past 7 days. The problem occurs daily. The problem has been unchanged. Associated symptoms include arthralgias. Pertinent negatives include no abdominal pain, chest pain, coughing or neck pain. Associated symptoms comments: Foot pain. The symptoms are aggravated by standing and walking. He has tried acetaminophen for the symptoms. The treatment provided no relief.    Past Medical History  Diagnosis Date  . Asthma     Past Surgical History  Procedure Laterality Date  . Hand surgery      History reviewed. No pertinent family history.  History  Substance Use Topics  . Smoking status: Current Every Day Smoker -- 0.50 packs/day    Types: Cigarettes  . Smokeless tobacco: Never Used  . Alcohol Use: No      Review of Systems  Constitutional: Negative for activity change.       All ROS Neg except as noted in HPI  HENT: Negative for nosebleeds and neck pain.   Eyes: Negative for photophobia and discharge.  Respiratory: Positive for wheezing. Negative for cough and shortness of breath.   Cardiovascular: Negative for chest pain and palpitations.  Gastrointestinal: Negative for abdominal pain and blood in stool.  Genitourinary: Negative for dysuria, frequency and hematuria.    Musculoskeletal: Positive for arthralgias. Negative for back pain.  Skin: Negative.   Neurological: Negative for dizziness, seizures and speech difficulty.  Psychiatric/Behavioral: Negative for hallucinations and confusion.    Allergies  Hydrocodone  Home Medications   Current Outpatient Rx  Name  Route  Sig  Dispense  Refill  . acetaminophen (TYLENOL) 500 MG tablet   Oral   Take 1,000 mg by mouth every 6 (six) hours as needed. Pain         . oxyCODONE-acetaminophen (PERCOCET/ROXICET) 5-325 MG per tablet   Oral   Take 1 tablet by mouth daily as needed for pain.           BP 117/83  Pulse 74  Temp(Src) 97.9 F (36.6 C) (Oral)  Resp 20  Ht 5\' 11"  (1.803 m)  Wt 195 lb (88.451 kg)  BMI 27.21 kg/m2  SpO2 100%  Physical Exam  Nursing note and vitals reviewed. Constitutional: He is oriented to person, place, and time. He appears well-developed and well-nourished.  Non-toxic appearance.  HENT:  Head: Normocephalic.  Right Ear: Tympanic membrane and external ear normal.  Left Ear: Tympanic membrane and external ear normal.  Eyes: EOM and lids are normal. Pupils are equal, round, and reactive to light.  Neck: Normal range of motion. Neck supple. Carotid bruit is not present.  Cardiovascular: Normal rate, regular rhythm, normal heart sounds, intact distal pulses and normal pulses.   Pulmonary/Chest: Breath sounds normal. No respiratory distress.  Abdominal: Soft. Bowel sounds are normal. There is no tenderness. There is  no guarding.  Musculoskeletal: Normal range of motion.       Feet:  The Achilles tendon is intact. The dorsalis pedis and posterior tibial pulses are 2+. There is pain with flexing and extending the toes, but the patient can do it. The capillary refill is less than 3 seconds of each toe. There no lesions between the toes. There is no malalignment of the metatarsal heads. There is no puncture wound to the plantar surface.  Lymphadenopathy:       Head (right  side): No submandibular adenopathy present.       Head (left side): No submandibular adenopathy present.    He has no cervical adenopathy.  Neurological: He is alert and oriented to person, place, and time. He has normal strength. No cranial nerve deficit or sensory deficit.  Skin: Skin is warm and dry.  Psychiatric: He has a normal mood and affect. His speech is normal.    ED Course  Procedures (including critical care time)  Labs Reviewed - No data to display Dg Foot Complete Left  07/08/2012  *RADIOLOGY REPORT*  Clinical Data: Pain after blunt trauma.  LEFT FOOT - COMPLETE 3+ VIEW  Comparison: None.  Findings: Tiny calcaneal spur at the plantar aponeurosis. Negative for fracture, dislocation, or other acute abnormality.  Normal alignment and mineralization. No significant degenerative change. Regional soft tissues unremarkable.  IMPRESSION:  Negative   Original Report Authenticated By: D. Andria Rhein, MD      No diagnosis found.    MDM  I have reviewed nursing notes, vital signs, and all appropriate lab and imaging results for this patient. Patient sustained blunt trauma to the left foot approximately one week ago. He continues to have pain and was concerned about possible fracture.  X-ray of the left foot is negative for fracture or dislocation. Examination reveals good capillary refill of less than 3 seconds, dorsalis pedis posterior tibial pulses are 2+. Patient has pain with flexing the toes but he can flex him.  Findings were given to the patient. Patient is given orthopedic referral if you should have any additional problem with his left foot.       Kathie Dike, [place] 07/09/12 [phone]

## 2012-07-09 NOTE — ED Provider Notes (Signed)
Medical screening examination/treatment/procedure(s) were performed by non-physician practitioner and as supervising physician I was immediately available for consultation/collaboration.  Christopher J. Pollina, MD 07/09/12 1519 

## 2013-01-18 ENCOUNTER — Encounter (HOSPITAL_COMMUNITY): Payer: Self-pay | Admitting: *Deleted

## 2013-01-18 ENCOUNTER — Emergency Department (HOSPITAL_COMMUNITY)
Admission: EM | Admit: 2013-01-18 | Discharge: 2013-01-18 | Disposition: A | Discharge status: Home / Self Care | Payer: Self-pay | Attending: Emergency Medicine | Admitting: Emergency Medicine

## 2013-01-18 DIAGNOSIS — F17200 Nicotine dependence, unspecified, uncomplicated: Secondary | ICD-10-CM | POA: Insufficient documentation

## 2013-01-18 DIAGNOSIS — K044 Acute apical periodontitis of pulpal origin: Secondary | ICD-10-CM | POA: Insufficient documentation

## 2013-01-18 DIAGNOSIS — Z792 Long term (current) use of antibiotics: Secondary | ICD-10-CM | POA: Insufficient documentation

## 2013-01-18 DIAGNOSIS — K0889 Other specified disorders of teeth and supporting structures: Secondary | ICD-10-CM

## 2013-01-18 DIAGNOSIS — K047 Periapical abscess without sinus: Secondary | ICD-10-CM

## 2013-01-18 DIAGNOSIS — J45909 Unspecified asthma, uncomplicated: Secondary | ICD-10-CM | POA: Insufficient documentation

## 2013-01-18 DIAGNOSIS — K089 Disorder of teeth and supporting structures, unspecified: Secondary | ICD-10-CM | POA: Insufficient documentation

## 2013-01-18 MED ORDER — OXYCODONE-ACETAMINOPHEN 5-325 MG PO TABS
2.0000 | ORAL_TABLET | Freq: Once | ORAL | Status: AC
  Administered 2013-01-18: 2 via ORAL
  Filled 2013-01-18: qty 2

## 2013-01-18 MED ORDER — AMOXICILLIN 500 MG PO CAPS: 500.0000 mg | ORAL_CAPSULE | Freq: Three times a day (TID) | ORAL | Status: DC

## 2013-01-18 MED ORDER — OXYCODONE-ACETAMINOPHEN 5-325 MG PO TABS: 1.0000 | ORAL_TABLET | Freq: Four times a day (QID) | ORAL | Status: DC | PRN

## 2013-01-18 NOTE — ED Provider Notes (Signed)
Medical screening examination/treatment/procedure(s) were performed by non-physician practitioner and as supervising physician I was immediately available for consultation/collaboration.   Gavin Pound. Tex Conroy, MD 01/18/13 1045

## 2013-01-18 NOTE — ED Provider Notes (Signed)
CSN: [id number]     Arrival date & time 01/18/13  1013 History   First MD Initiated Contact with Patient 01/18/13 1022     Chief Complaint  Patient presents with  . Dental Pain   (Consider location/radiation/quality/duration/timing/severity/associated sxs/prior Treatment) HPI Comments: 42 year old male presents to the emergency department complaining of dental pain over the past 2 days. Patient states he has had an infected tooth on and off for the past year, however just got insurance and has an appointment this coming week with Dr. Lelon Perla who is his old dentist. Over the past 2 days the pain has been severe, throbbing, 10 out of 10, located on the left upper teeth. Laying on the left side of his face and chewing makes the pain worse. Nothing in specific makes the pain better. He has not tried any alleviating factors for his pain other than applying a heating pad. This morning he woke up with swelling to the left side of his face. Denies fever or chills. No difficulty swallowing.  Patient is a 42 y.o. male presenting with tooth pain. The history is provided by the patient and the spouse.  Dental Pain Associated symptoms: no fever     Past Medical History  Diagnosis Date  . Asthma    Past Surgical History  Procedure Laterality Date  . Hand surgery     No family history on file. History  Substance Use Topics  . Smoking status: Current Every Day Smoker -- 0.50 packs/day    Types: Cigarettes  . Smokeless tobacco: Never Used  . Alcohol Use: No    Review of Systems  Constitutional: Negative for fever and chills.  HENT: Positive for dental problem. Negative for trouble swallowing.   All other systems reviewed and are negative.    Allergies  Hydrocodone  Home Medications   Current Outpatient Rx  Name  Route  Sig  Dispense  Refill  . ibuprofen (ADVIL,MOTRIN) 200 MG tablet   Oral   Take 400 mg by mouth every 6 (six) hours as needed for pain (pain).         Marland Kitchen amoxicillin  (AMOXIL) 500 MG capsule   Oral   Take 1 capsule (500 mg total) by mouth 3 (three) times daily.   21 capsule   0   . oxyCODONE-acetaminophen (PERCOCET) 5-325 MG per tablet   Oral   Take 1-2 tablets by mouth every 6 (six) hours as needed for pain.   6 tablet   0    BP 115/81  Pulse 84  Temp(Src) 99 F (37.2 C) (Oral)  Resp 18  Ht 5\' 11"  (1.803 m)  Wt 175 lb (79.379 kg)  BMI 24.42 kg/m2  SpO2 99% Physical Exam  Nursing note and vitals reviewed. Constitutional: He is oriented to person, place, and time. He appears well-developed and well-nourished. No distress.  HENT:  Head: Normocephalic and atraumatic.  Mouth/Throat: Uvula is midline and oropharynx is clear and moist.    Poor dentition throughout. Multiple dental caries. Mild facial swelling noted over left maxillary region.  Eyes: Conjunctivae are normal.  Neck: Normal range of motion. Neck supple.  Cardiovascular: Normal rate, regular rhythm and normal heart sounds.   Pulmonary/Chest: Effort normal and breath sounds normal.  Musculoskeletal: Normal range of motion. He exhibits no edema.  Lymphadenopathy:       Head (left side): Submandibular adenopathy present.    He has no cervical adenopathy.  Neurological: He is alert and oriented to person, place, and time.  Skin:  Skin is warm and dry. He is not diaphoretic.  Psychiatric: He has a normal mood and affect. His behavior is normal.    ED Course  Procedures (including critical care time) Labs Review Labs Reviewed - No data to display Imaging Review No results found.  MDM   1. Dental infection   2. Pain, dental     Dental pain associated with dental infection. No evidence of dental abscess. Patient is afebrile, non toxic appearing and swallowing secretions well. I discussed importance of dental follow up for ultimate management of dental pain, he has an appointment this coming week with Dr. Lelon Perla. I will also give amoxicillin and pain control. Patient voices  understanding and is agreeable to plan.   Trevor Mace, PA-C 01/18/13 1038

## 2013-01-18 NOTE — ED Notes (Signed)
Pt reports he has had an abscessed tooth for the past year, pain started two days ago. Swelling noted to left side of face. Has not seen dentist

## 2013-03-03 ENCOUNTER — Encounter (HOSPITAL_COMMUNITY): Payer: Self-pay | Admitting: Emergency Medicine

## 2013-03-03 ENCOUNTER — Emergency Department (HOSPITAL_COMMUNITY)
Admission: EM | Admit: 2013-03-03 | Discharge: 2013-03-03 | Disposition: A | Discharge status: Home / Self Care | Payer: Worker's Compensation | Attending: Emergency Medicine | Admitting: Emergency Medicine

## 2013-03-03 DIAGNOSIS — Z792 Long term (current) use of antibiotics: Secondary | ICD-10-CM | POA: Insufficient documentation

## 2013-03-03 DIAGNOSIS — J45909 Unspecified asthma, uncomplicated: Secondary | ICD-10-CM | POA: Insufficient documentation

## 2013-03-03 DIAGNOSIS — R21 Rash and other nonspecific skin eruption: Secondary | ICD-10-CM | POA: Insufficient documentation

## 2013-03-03 DIAGNOSIS — K047 Periapical abscess without sinus: Secondary | ICD-10-CM | POA: Insufficient documentation

## 2013-03-03 DIAGNOSIS — F17200 Nicotine dependence, unspecified, uncomplicated: Secondary | ICD-10-CM | POA: Insufficient documentation

## 2013-03-03 MED ORDER — OXYCODONE-ACETAMINOPHEN 5-325 MG PO TABS: 2.0000 | ORAL_TABLET | ORAL | Status: DC | PRN

## 2013-03-03 MED ORDER — CLINDAMYCIN HCL 150 MG PO CAPS: 150.0000 mg | ORAL_CAPSULE | Freq: Four times a day (QID) | ORAL | Status: DC

## 2013-03-03 NOTE — ED Provider Notes (Signed)
CSN: [id number]     Arrival date & time 03/03/13  1041 History   First MD Initiated Contact with Patient 03/03/13 1049     Chief Complaint  Patient presents with  . Facial Swelling   (Consider location/radiation/quality/duration/timing/severity/associated sxs/prior Treatment) HPI  42 year old male presents complaining of dental pain and facial swelling. Patient reports he has history of having dental abscess. He has been having dental pain in his tooth on left side of face throughout the summer, worsening in the past week. He scheduled appointment for dental extraction for the next 2 days. However since 2 days ago he has noticed increased L face swelling with left eyelid swelling. Pain is described as a sharp throbbing sensation, with increased pressure. Pain worsened with chewing, and the cold air. The swollen eyelid is new. He tried taking ibuprofen at home, taking left over amoxicillin, and also some pain medication with minimal improvement. Pain is keeping him up at night. Denies fever, chills, vision changes, pain with eye movement, ear pain, numbness, weakness, neck pain, or rash. Denies any recent trauma. Denies any itchiness or any environmental changes that can cause allergic reaction.  Dentist: Dr. Lelon Perla  Past Medical History  Diagnosis Date  . Asthma    Past Surgical History  Procedure Laterality Date  . Hand surgery     No family history on file. History  Substance Use Topics  . Smoking status: Current Every Day Smoker -- 0.50 packs/day    Types: Cigarettes  . Smokeless tobacco: Never Used  . Alcohol Use: No    Review of Systems  Constitutional: Negative for fever.  HENT: Positive for dental problem and facial swelling. Negative for voice change.   Eyes: Negative for pain, itching and visual disturbance.  Skin: Positive for rash.    Allergies  Hydrocodone  Home Medications   Current Outpatient Rx  Name  Route  Sig  Dispense  Refill  . amoxicillin (AMOXIL)  500 MG capsule   Oral   Take 1 capsule (500 mg total) by mouth 3 (three) times daily.   21 capsule   0   . ibuprofen (ADVIL,MOTRIN) 200 MG tablet   Oral   Take 400 mg by mouth every 6 (six) hours as needed for pain (pain).         Marland Kitchen oxyCODONE-acetaminophen (PERCOCET) 5-325 MG per tablet   Oral   Take 1-2 tablets by mouth every 6 (six) hours as needed for pain.   6 tablet   0    There were no vitals taken for this visit. Physical Exam  Nursing note and vitals reviewed. Constitutional: He appears well-developed and well-nourished. No distress.  HENT:  Right Ear: External ear normal.  Left Ear: External ear normal.  Nose: Nose normal.  Mouth/Throat: Oropharynx is clear and moist. No oropharyngeal exudate.  Moderate dental decay with gingival erythema overlying the left upper gumline, tenderness to palpation without obvious abscess.  No trismus.  No airway compromised.  Eyes: Conjunctivae are normal.  Left upper eyelid and moderately edematous and erythematous, mild warmth without evidence of abscess  No conjunctivitis, no pain with eye movement.  Neck: Normal range of motion. Neck supple.  Lymphadenopathy:    He has no cervical adenopathy.  Neurological: He is alert.  Psychiatric: He has a normal mood and affect.    ED Course  Procedures (including critical care time)  11:02 AM Pt with dental decay and likely sinus involvement causing facial swelling more pronounce to L upper eyelid.  He  is scheduled to be seen by his dentist in 2 days.  Will d/c with abx, pain meds, referral to oral surgeon as needed.  Recommend return precaution if worsen.  Pt voice understanding and agrees with plan.    Labs Review Labs Reviewed - No data to display Imaging Review No results found.  EKG Interpretation   None       MDM   1. Periapical abscess with facial involvement    BP 128/71  Pulse 91  Temp(Src) 97.7 F (36.5 C) (Oral)  Resp 16  Ht 6' (1.829 m)  Wt 175 lb  (79.379 kg)  BMI 23.73 kg/m2  SpO2 98%     Fayrene Helper, PA-C 03/03/13 1107

## 2013-03-03 NOTE — ED Provider Notes (Signed)
Medical screening examination/treatment/procedure(s) were performed by non-physician practitioner and as supervising physician I was immediately available for consultation/collaboration.  Sterlin Knightly M Novia Lansberry, MD 03/03/13 1624 

## 2013-03-03 NOTE — Progress Notes (Signed)
[facility] did not get to see patient but will be sending information about the [facility] [employer] Card program, using the address provided.  °

## 2013-03-03 NOTE — ED Notes (Signed)
Pt states has an abscess tooth on L side, has an appt to get extracted on Wednesday, but Saturday started having L eye swelling, states this morning was swollen shut and pulsating.

## 2013-03-12 ENCOUNTER — Encounter (HOSPITAL_COMMUNITY): Payer: Self-pay | Admitting: Emergency Medicine

## 2013-03-12 ENCOUNTER — Emergency Department (HOSPITAL_COMMUNITY)
Admission: EM | Admit: 2013-03-12 | Discharge: 2013-03-12 | Disposition: A | Discharge status: Home / Self Care | Payer: Worker's Compensation | Attending: Emergency Medicine | Admitting: Emergency Medicine

## 2013-03-12 DIAGNOSIS — F17200 Nicotine dependence, unspecified, uncomplicated: Secondary | ICD-10-CM | POA: Insufficient documentation

## 2013-03-12 DIAGNOSIS — K0889 Other specified disorders of teeth and supporting structures: Secondary | ICD-10-CM

## 2013-03-12 DIAGNOSIS — K089 Disorder of teeth and supporting structures, unspecified: Secondary | ICD-10-CM | POA: Insufficient documentation

## 2013-03-12 DIAGNOSIS — J45909 Unspecified asthma, uncomplicated: Secondary | ICD-10-CM | POA: Insufficient documentation

## 2013-03-12 MED ORDER — HYDROCODONE-ACETAMINOPHEN 5-325 MG PO TABS: 2.0000 | ORAL_TABLET | ORAL | Status: DC | PRN

## 2013-03-12 NOTE — ED Provider Notes (Signed)
CSN: [id number]     Arrival date & time 03/12/13  0913 History   First MD Initiated Contact with Patient 03/12/13 [phone]     Chief Complaint  Patient presents with  . Dental Pain   (Consider location/radiation/quality/duration/timing/severity/associated sxs/prior Treatment) HPI  42 year old male history of dental abscess presents for worsening dental pain.  Patient has been treated for a periapical abscess with clindamycin which he took for the full duration. His facial infection secondary to the dental infect has resolved. He is scheduled to followup with an oral surgeon on November 28. He is here requesting for additional pain medication as he ran out of his current pain medication. He denies any fever, chills, or trouble swallowing, jaw pain, chest pain, shortness of breath, or rash. He is currently trying to quit smoking. He takes ibuprofen which helps minimally.   Past Medical History  Diagnosis Date  . Asthma    Past Surgical History  Procedure Laterality Date  . Hand surgery     No family history on file. History  Substance Use Topics  . Smoking status: Current Every Day Smoker -- 0.50 packs/day    Types: Cigarettes  . Smokeless tobacco: Never Used  . Alcohol Use: No    Review of Systems  Constitutional: Negative for fever.  HENT: Positive for dental problem.   Skin: Negative for rash.  Neurological: Negative for headaches.    Allergies  Hydrocodone  Home Medications   Current Outpatient Rx  Name  Route  Sig  Dispense  Refill  . clindamycin (CLEOCIN) 150 MG capsule   Oral   Take 1 capsule (150 mg total) by mouth every 6 (six) hours.   28 capsule   0   . ibuprofen (ADVIL,MOTRIN) 200 MG tablet   Oral   Take 800 mg by mouth every 6 (six) hours as needed for pain.         Marland Kitchen oxyCODONE-acetaminophen (PERCOCET) 5-325 MG per tablet   Oral   Take 2 tablets by mouth every 4 (four) hours as needed for pain.   6 tablet   0    BP 157/122  Pulse 73  Temp(Src)  97.9 F (36.6 C) (Oral)  Resp 18  SpO2 100% Physical Exam  Nursing note and vitals reviewed. Constitutional: He appears well-nourished. No distress.  HENT:  Head: Normocephalic and atraumatic.  Mouth/Throat: Oropharynx is clear and moist.  Poor dentition. ttp to L upper canine region.  No trismus, no evidence of deep tissue infection.    Neck: Normal range of motion. Neck supple.  Lymphadenopathy:    He has no cervical adenopathy.  Neurological: He is alert.  Skin: No rash noted.  Psychiatric: He has a normal mood and affect.    ED Course  Procedures (including critical care time)  10:41 AM Resolving facial infection, recurrent dental pain.  Will see oral surgeon in 5 days.  Will give short course of pain meds in the mean time.  Pt sts he can tolerates vicodin, with only minimal itch, no hives.   Labs Review Labs Reviewed - No data to display Imaging Review No results found.  EKG Interpretation   None       MDM   1. Pain, dental    BP 157/122  Pulse 73  Temp(Src) 97.9 F (36.6 C) (Oral)  Resp 18  SpO2 100%     Fayrene Helper, PA-C 03/12/13 1043

## 2013-03-12 NOTE — Progress Notes (Signed)
[facility] provided patient with a list of primary care resources and dental resources. Patient stated that he was in the process of getting the [facility] Card with [employer] [facility] on 10/28.

## 2013-03-12 NOTE — ED Notes (Signed)
Pt states that has dental pain that stated really bad yesterday morning and this happens usually once a week.  Pt has appt to see oral surgeon next Wed, Oct 28.

## 2013-03-13 NOTE — ED Provider Notes (Signed)
Medical screening examination/treatment/procedure(s) were conducted as a shared visit with non-physician practitioner(s) or resident and myself. I personally evaluated the patient during the encounter and agree with the findings and plan unless otherwise indicated. I have personally reviewed any xrays and/ or EKG's with the provider and I agree with interpretation.  Left dental pain, recent abscess, on clindamycin. Pt has dental fup. Gradual worsening pain. Tender left upper frontal gingiva with poor dentition, no trismus, no abscess palpated, well appearing otherwise.  Smoking cessation and outpatient follow up was discussed with the patient for 3 to 5 Minutes.  Fup with oral surgery.   Dental pain, smoker   Enid Skeens, MD 03/13/13 1504

## 2013-03-27 ENCOUNTER — Ambulatory Visit: Payer: Worker's Compensation | Attending: Internal Medicine

## 2013-04-25 ENCOUNTER — Ambulatory Visit: Payer: Self-pay

## 2013-05-19 ENCOUNTER — Ambulatory Visit: Payer: Self-pay

## 2013-05-19 ENCOUNTER — Ambulatory Visit: Payer: Self-pay | Admitting: Internal Medicine

## 2013-09-10 ENCOUNTER — Emergency Department (HOSPITAL_COMMUNITY)
Admission: EM | Admit: 2013-09-10 | Discharge: 2013-09-11 | Disposition: A | Discharge status: Home / Self Care | Payer: Worker's Compensation | Attending: Emergency Medicine | Admitting: Emergency Medicine

## 2013-09-10 ENCOUNTER — Encounter (HOSPITAL_COMMUNITY): Payer: Self-pay | Admitting: Emergency Medicine

## 2013-09-10 DIAGNOSIS — J45909 Unspecified asthma, uncomplicated: Secondary | ICD-10-CM | POA: Insufficient documentation

## 2013-09-10 DIAGNOSIS — IMO0001 Reserved for inherently not codable concepts without codable children: Secondary | ICD-10-CM | POA: Insufficient documentation

## 2013-09-10 DIAGNOSIS — F17200 Nicotine dependence, unspecified, uncomplicated: Secondary | ICD-10-CM | POA: Insufficient documentation

## 2013-09-10 DIAGNOSIS — F1110 Opioid abuse, uncomplicated: Secondary | ICD-10-CM

## 2013-09-10 DIAGNOSIS — M2550 Pain in unspecified joint: Secondary | ICD-10-CM

## 2013-09-10 DIAGNOSIS — M549 Dorsalgia, unspecified: Secondary | ICD-10-CM | POA: Insufficient documentation

## 2013-09-10 DIAGNOSIS — R6883 Chills (without fever): Secondary | ICD-10-CM | POA: Insufficient documentation

## 2013-09-10 DIAGNOSIS — G8929 Other chronic pain: Secondary | ICD-10-CM | POA: Insufficient documentation

## 2013-09-10 DIAGNOSIS — M2559 Pain in other specified joint: Secondary | ICD-10-CM | POA: Insufficient documentation

## 2013-09-10 DIAGNOSIS — F1120 Opioid dependence, uncomplicated: Secondary | ICD-10-CM | POA: Insufficient documentation

## 2013-09-10 DIAGNOSIS — R110 Nausea: Secondary | ICD-10-CM | POA: Insufficient documentation

## 2013-09-10 HISTORY — DX: Opioid dependence, uncomplicated: F11.20

## 2013-09-10 LAB — CBC WITH DIFFERENTIAL/PLATELET
Basophils Absolute: 0 10*3/uL (ref 0.0–0.1)
Basophils Relative: 0 % (ref 0–1)
EOS PCT: 0 % (ref 0–5)
Eosinophils Absolute: 0 10*3/uL (ref 0.0–0.7)
HEMATOCRIT: 50.1 % (ref 39.0–52.0)
HEMOGLOBIN: 17.4 g/dL — AB (ref 13.0–17.0)
LYMPHS ABS: 1.8 10*3/uL (ref 0.7–4.0)
LYMPHS PCT: 16 % (ref 12–46)
MCH: 30.5 pg (ref 26.0–34.0)
MCHC: 34.7 g/dL (ref 30.0–36.0)
MCV: 87.9 fL (ref 78.0–100.0)
MONO ABS: 0.8 10*3/uL (ref 0.1–1.0)
MONOS PCT: 7 % (ref 3–12)
Neutro Abs: 8.7 10*3/uL — ABNORMAL HIGH (ref 1.7–7.7)
Neutrophils Relative %: 77 % (ref 43–77)
Platelets: 167 10*3/uL (ref 150–400)
RBC: 5.7 MIL/uL (ref 4.22–5.81)
RDW: 13.8 % (ref 11.5–15.5)
WBC: 11.4 10*3/uL — AB (ref 4.0–10.5)

## 2013-09-10 LAB — COMPREHENSIVE METABOLIC PANEL
ALT: 26 U/L (ref 0–53)
AST: 22 U/L (ref 0–37)
Albumin: 4.1 g/dL (ref 3.5–5.2)
Alkaline Phosphatase: 132 U/L — ABNORMAL HIGH (ref 39–117)
BILIRUBIN TOTAL: 0.4 mg/dL (ref 0.3–1.2)
BUN: 11 mg/dL (ref 6–23)
CALCIUM: 9.7 mg/dL (ref 8.4–10.5)
CO2: 17 meq/L — AB (ref 19–32)
CREATININE: 0.81 mg/dL (ref 0.50–1.35)
Chloride: 102 mEq/L (ref 96–112)
GLUCOSE: 106 mg/dL — AB (ref 70–99)
Potassium: 3.7 mEq/L (ref 3.7–5.3)
Sodium: 141 mEq/L (ref 137–147)
Total Protein: 8.3 g/dL (ref 6.0–8.3)

## 2013-09-10 LAB — ETHANOL: Alcohol, Ethyl (B): 36 mg/dL — ABNORMAL HIGH (ref 0–11)

## 2013-09-10 MED ORDER — LOPERAMIDE HCL 2 MG PO CAPS: 2.0000 mg | ORAL_CAPSULE | ORAL | Status: DC | PRN

## 2013-09-10 MED ORDER — ACETAMINOPHEN 325 MG PO TABS: 650.0000 mg | ORAL_TABLET | ORAL | Status: DC | PRN

## 2013-09-10 MED ORDER — IBUPROFEN 400 MG PO TABS: 600.0000 mg | ORAL_TABLET | Freq: Three times a day (TID) | ORAL | Status: DC | PRN

## 2013-09-10 MED ORDER — METHOCARBAMOL 500 MG PO TABS: 500.0000 mg | ORAL_TABLET | Freq: Three times a day (TID) | ORAL | Status: DC | PRN

## 2013-09-10 MED ORDER — NAPROXEN 250 MG PO TABS: 500.0000 mg | ORAL_TABLET | Freq: Two times a day (BID) | ORAL | Status: DC | PRN

## 2013-09-10 MED ORDER — CLONIDINE HCL 0.1 MG PO TABS: 0.1000 mg | ORAL_TABLET | ORAL | Status: DC

## 2013-09-10 MED ORDER — ALUM & MAG HYDROXIDE-SIMETH 200-200-20 MG/5ML PO SUSP
30.0000 mL | ORAL | Status: DC | PRN
Start: 2013-09-10 — End: 2013-09-11

## 2013-09-10 MED ORDER — DICYCLOMINE HCL 20 MG PO TABS: 20.0000 mg | ORAL_TABLET | Freq: Four times a day (QID) | ORAL | Status: DC | PRN

## 2013-09-10 MED ORDER — CLONIDINE HCL 0.1 MG PO TABS: 0.1000 mg | ORAL_TABLET | Freq: Every day | ORAL | Status: DC

## 2013-09-10 MED ORDER — CLONIDINE HCL 0.2 MG PO TABS: 0.2000 mg | ORAL_TABLET | Freq: Two times a day (BID) | ORAL | Status: DC

## 2013-09-10 MED ORDER — ONDANSETRON HCL 4 MG PO TABS: 4.0000 mg | ORAL_TABLET | Freq: Three times a day (TID) | ORAL | Status: DC | PRN

## 2013-09-10 MED ORDER — HYDROXYZINE HCL 25 MG PO TABS: 25.0000 mg | ORAL_TABLET | Freq: Four times a day (QID) | ORAL | Status: DC | PRN

## 2013-09-10 MED ORDER — ZOLPIDEM TARTRATE 5 MG PO TABS: 5.0000 mg | ORAL_TABLET | Freq: Every evening | ORAL | Status: DC | PRN

## 2013-09-10 MED ORDER — CLONIDINE HCL 0.1 MG PO TABS
0.1000 mg | ORAL_TABLET | Freq: Four times a day (QID) | ORAL | Status: DC
  Administered 2013-09-11: 0.1 mg via ORAL
  Filled 2013-09-10 (×2): qty 1

## 2013-09-10 MED ORDER — NICOTINE 21 MG/24HR TD PT24: 21.0000 mg | MEDICATED_PATCH | Freq: Every day | TRANSDERMAL | Status: DC

## 2013-09-10 MED ORDER — ONDANSETRON 4 MG PO TBDP
4.0000 mg | ORAL_TABLET | Freq: Four times a day (QID) | ORAL | Status: DC | PRN
  Administered 2013-09-11: 4 mg via ORAL
  Filled 2013-09-10: qty 1

## 2013-09-10 MED ORDER — ONDANSETRON HCL 4 MG PO TABS: 4.0000 mg | ORAL_TABLET | Freq: Four times a day (QID) | ORAL | Status: DC

## 2013-09-10 MED ORDER — ACETAMINOPHEN 325 MG PO TABS: 650.0000 mg | ORAL_TABLET | Freq: Once | ORAL | Status: DC

## 2013-09-10 NOTE — ED Provider Notes (Signed)
Medical screening examination/treatment/procedure(s) were performed by non-physician practitioner and as supervising physician I was immediately available for consultation/collaboration.   EKG Interpretation None        Charles B. Bernette MayersSheldon, MD 09/10/13 [phone]

## 2013-09-10 NOTE — ED Provider Notes (Addendum)
CSN: [id number]     Arrival date & time 09/10/13  2121 History  This chart was scribed for non-physician practitioner Marlon Peliffany Yuko Coventry, PA-C working with Leonette Mostharles B. Bernette MayersSheldon, MD by Dorothey Basemania Sutton, ED Scribe. This patient was seen in room C23C/C23C and the patient's care was started at 10:29 PM.    Chief Complaint  Patient presents with  . Addiction Problem   The history is provided by the patient. No language interpreter was used.   HPI Comments: Jesus Glass is a 43 y.o. male who presents to the Emergency Department requesting detoxification from heroin. Patient states that he has been using heroin daily for the past 8 months, last use was yesterday morning. He states that he started using heroin when he was unable to acquire pain medication for his chronic back pain. He reports chills, nausea, diffuse myalgias and arthralgias (especially to his back) secondary to the withdrawal. Patient states that he does want to quit using heroin and states that he believes methadone management is a good option for him. He denies hallucinations, suicidal or homicidal ideations. Patient admits to occasionally using marijuana, but denies other illicit drug use. Patient is a current every day smoker, 0.5 PPD, but does not drink alcohol. He reports allergies to hydrocodone and Tramadol. Patient has a history of asthma.   Past Medical History  Diagnosis Date  . Asthma   . Heroin addiction    Past Surgical History  Procedure Laterality Date  . Hand surgery     No family history on file. History  Substance Use Topics  . Smoking status: Current Every Day Smoker -- 0.50 packs/day    Types: Cigarettes  . Smokeless tobacco: Never Used  . Alcohol Use: No    Review of Systems  Constitutional: Positive for chills.  Gastrointestinal: Positive for nausea.  Musculoskeletal: Positive for arthralgias, back pain and myalgias.  All other systems reviewed and are negative.     Allergies  Hydrocodone and  Tramadol  Home Medications   Prior to Admission medications   Medication Sig Start Date End Date Taking? Authorizing Provider  oxyCODONE-acetaminophen (PERCOCET) 10-325 MG per tablet Take 4 tablets by mouth every 4 (four) hours as needed for pain.   Yes Historical Provider, MD   Triage Vitals: BP 136/94  Pulse 88  Temp(Src) 99.3 F (37.4 C) (Oral)  Resp 14  Ht 5\' 10"  (1.778 m)  Wt 175 lb (79.379 kg)  BMI 25.11 kg/m2  SpO2 99%  Physical Exam  Nursing note and vitals reviewed. Constitutional: He is oriented to person, place, and time. He appears well-developed and well-nourished. No distress.  HENT:  Head: Normocephalic and atraumatic.  Eyes: Conjunctivae are normal.  Neck: Normal range of motion. Neck supple.  Cardiovascular: Normal rate, regular rhythm and normal heart sounds.   Pulmonary/Chest: Effort normal and breath sounds normal. No respiratory distress.  Abdominal: Soft. He exhibits no distension.  Musculoskeletal: Normal range of motion.  Neurological: He is alert and oriented to person, place, and time.  Skin: Skin is warm and dry.  Psychiatric: He has a normal mood and affect. His behavior is normal.    ED Course  Procedures (including critical care time)  DIAGNOSTIC STUDIES: Oxygen Saturation is 99% on room air, normal by my interpretation.    COORDINATION OF CARE: 10:35 PM- Ordered ethanol, drug screen panel, CBC, CMP. Ordered Catapres, Nicoderm CQ, Tylenol, Bentyl, Atarax, Imodium, Robaxin, Naprosyn, Zofran, Maalox, Ambien, and ibuprofen to manage symptoms. Discussed treatment plan with patient at bedside  and patient verbalized agreement.     Labs Review Labs Reviewed  CBC WITH DIFFERENTIAL - Abnormal; Notable for the following:    WBC 11.4 (*)    Hemoglobin 17.4 (*)    Neutro Abs 8.7 (*)    All other components within normal limits  ETHANOL  URINE RAPID DRUG SCREEN (HOSP PERFORMED)  COMPREHENSIVE METABOLIC PANEL    Imaging Review No results  found.   EKG Interpretation None      MDM   Final diagnoses:  Heroin abuse     Patients medical screening labs are pending. Catapres protocol ordered. Psych holding orders in place.  . Pt appears to be in no acute distress and is not SI/HI or hallucination.  Discussed with Dr. Bernette MayersSheldon, heroin abuse does not meet protocol for inpatient detox. Will give prescriptions for Clonidine and Zofran to help with withdrawal symptoms.  I personally performed the services described in this documentation, which was scribed in my presence. The recorded information has been reviewed and is accurate.     Dorthula Matasiffany G Trudie Cervantes, PA-C 09/10/13 2337

## 2013-09-10 NOTE — ED Notes (Signed)
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 Respirations unlabored / denies pain or discomfort.

## 2013-09-10 NOTE — ED Notes (Signed)
Pt. wanded by security at triage . Pt. wearing paper scrubs.

## 2013-09-10 NOTE — Discharge Instructions (Signed)
Heroin Abuse and Withdrawal Heroin is an illegal opiate (narcotic) drug. It is very addictive. Once the effects of the drug are felt, the need to use the drug again (known as craving) is strong. When the drug is suddenly stopped after using it on a regular basis, significant and painful physical withdrawal symptoms are experienced.  EFFECTS OF HEROIN USE AND ABUSE The drug produces a profound feeling of pleasure and relaxation (euphoria) that lasts for a short time. This is followed by sleepiness and then agitation with craving for more drugs. After a short time of regular use (days or weeks), dependence on the drug is developed. This means the user will become ill without it (withdrawal) and needs to keep using the drug to function. This is how fast heroin abuse becomes physical dependence and addiction (chemical dependency). EFFECTS ON THE BRAIN Heroin is addictive because it activates regions of the brain that are responsible for producing both the pleasurable sensation of "reward" and physical dependence. Together, these actions account for the user's loss of control and the rapid development of drug dependence. HOW IT IS USED  Heroin is a drug that is mostly taken by a shot with a needle (injection) in the vein. Using a needle can have harmful effects on the user. Often times, the needle is unclean (unsterile), the heroin is contaminated with cutting agents, or heroin is used in combination with other drugs such as alcohol or cocaine.  In recent years, the purity of street heroin has increased a lot allowing users to "snort" the drug into their noses where it is absorbed through the lining of the nasal passages. You can become addicted this way. Many IV heroin users report they started by snorting. RELATED PROBLEMS  Needle sharing can cause AIDS. The AIDS virus is carried in contaminated blood left in the needle, syringe or other drug-related equipment. When unclean needles are used, the AIDS virus  is injected into the new user. There is no cure or proven vaccine for AIDS to prevent it.  Heroin use during pregnancy is associated with stillbirths. It can also increase the chance for miscarriage. Babies born addicted to heroin must undergo withdrawal after birth. These babies show a number of developmental problems.  Heroin use can cause serious health problems such as liver infection (hepatitis), skin abscesses, vein inflammation and heart disease. SYMPTOMS The signs and symptoms of heroin use include:  Euphoria.  Drowsiness.  Respiratory depression (which can progress until breathing stops).  Small (constricted) pupils and nausea.  Need for increasingly higher doses of the drug to get the same effect.  Weight loss.  "Track" marks (scars) on arms and legs.  Emotional instability. Symptoms of a heroin overdose include:   Shallow breathing.  Pinpoint pupils.  Clammy skin.  Convulsion.  Coma. WITHDRAWAL EFFECTS Withdrawal symptoms include:  Watery eyes.  Runny nose.  Yawning.  Loss of appetite.  Tremors.  Panic.  Chills.  Sweating.  Nausea.  Muscle cramps.  Not being able to sleep well (insomnia).  Depression and mood swings.  Elevations in blood pressure, pulse, respiratory rate and temperature. RISK OF OVERDOSE Because the drug is produced illegally, heroin users risk taking an unusually potent dose (due to differences in purity). This can lead to overdose, coma and possible death. Especially dangerous is the situation where a user has been "clean" (abstinent) from using the drug for a period of time and then relapses. Fatal overdoses can occur because the user fails to account for the change in  the body's tolerance to the drug. TREATMENT  There are many treatment options for heroin addiction. They include medications as well as behavioral therapies. Research has demonstrated that many people can successfully stop using heroin when medication is  combined with other supportive services. Patients can return to stable and productive lives, living completely free of all narcotics. Some of the medications and other treatment approaches used are:  Methadone. This is a medication that blocks the effects of heroin for about 24 hours. It has a proven record of success when prescribed at a high enough dosage level for people addicted to heroin.  Naloxone may be used to treat cases of overdose, as well as naltrexone. Both of these block the effects of morphine, heroin and other opiates. People who have become free of narcotics often take naltrexone to avoid relapse.  Buprenorphine is now available for treating addiction to heroin and other opiates. This medication offers less risk of addiction. It can be dispensed in the privacy of a doctor's office.  Several other medications for use in heroin treatment programs are also under study.  There are many effective behavioral treatments available for heroin addiction. These can include residential and outpatient approaches. 12-step programs, such as Narcotics Anonymous, have also proven to be effective. HOME CARE INSTRUCTIONS  Some individuals can successfully stop using heroin at home with medically assisted detoxification. This requires following your caregiver's instructions very carefully. This may include use of some of the following medications and therapies:  Stomach medicine can be used for the nausea.  Imodium can be used for the diarrhea.  Benadryl can be used for the sleeplessness and restlessness.  Anxiety medication such as Xanax or Ativan. These must be administered exactly as prescribed, usually with the help of a home caregiver.  Avoiding dehydration by drinking more fluids than usual. While water alone is fine, any non-alcoholic fluid is okay (soup, juice, etc.).  Providing quiet, calm support and cooling or warmth as needed.  Call your local emergency services (911 in U.S.) if  seizures occur or if the patient is unable to keep liquids down.  Keep a written record of medications given and times given. Addiction cannot be cured but it can be treated successfully.   Treatment centers are listed in the yellow pages under:  Substance Abuse.  Narcotics Anonymous.  Drug Abuse Counseling.  Most hospitals and clinics can refer you to a specialized care center.  The [place] government maintains a toll-free number for obtaining treatment referrals:1-949 406 9450 or 229-076-8297 (TDD) and maintains a website: http://findtreatment.SamedayNews.com.cy.  Other websites for additional information are:  www.mentalhealth.SamedayNews.com.cy.  VoiceTranslations.de.  In [place] treatment resources are listed in each [place] with listings available under:  Optometrist for [employer] or similar titles. Document Released: 05/11/2003 Document Revised: 07/31/2011 Document Reviewed: 04/24/2008 [facility] Patient Information 2014 [place], [place].  RESOURCE GUIDE  Chronic Pain Problems: Contact [facility]  [phone] Patients need to be referred by their primary care doctor.  Insufficient Money for Medicine: Contact United Way:  call "211" or [place] [phone].  No Primary Care Doctor: Call Health Connect  [phone] - can help you locate a primary care doctor that  accepts your insurance, provides certain services, etc. Physician Referral Service- [phone]  Agencies that provide inexpensive medical care: [facility]  [facility]  [phone] [facility]  [phone] [facility]  [phone] Planned Parenthood  [phone] [facility]  [phone]  [place]  [place] Providers: [facility]- [address], [address]  [phone], Mon-Fri 9am-7pm, Sat 9am-1pm [place], [address] [place], [address] [place]  [phone] [facility]- [address]  [place], [address], [phone]  Only accepts [place] patients after they have their name  applied to their card  Self Pay (no insurance) in [facility]: Sickle Cell Patients: Dr Kevan Ny, [facility]  [place], [facility]- [place]  [facility] [place]- [address], [facility]- see information above (Speak to [employer] if you do not have insurance)       -  [facility]- [place], [place],  [place], [place]  Dr Vista Lawman-  [address], [place], [facility]- [address], [phone]       -  [facility]- [address], [place], also [address] [phone]       -    [facility]- [address], [place], 1st & 3rd Saturday   every month, 10am-1pm  1) Find a Doctor and Pay Out of Pocket Although you won't have to find out who is covered by your insurance plan, it is a good idea to ask around and get recommendations. You will then need to call the office and see if the doctor you have chosen will accept you as a new patient and what types of options they offer for patients who are self-pay. Some doctors offer discounts or will set up payment plans for their patients who do not have insurance, but you will need to ask so you aren't surprised when you get to your appointment.  2) Contact Your Local Health Department Not all health departments have doctors that can see patients for sick visits, but many do, so it is worth a call to see if yours does. If you don't know where your local health department  is, you can check in your phone book. The CDC also has a tool to help you locate your state's health department, and many state websites also have listings of all of their local health departments.  3) Find a [facility] If your illness is not likely to be very severe or complicated, you may want to try a walk in clinic. These are popping up all over the country in pharmacies, drugstores, and shopping centers. They're usually staffed by nurse practitioners or physician assistants that have been trained to treat common illnesses and complaints. They're usually fairly quick and inexpensive. However, if you have serious medical issues or chronic medical problems, these are probably not your best option  [facility] [place], [facility], [address] [place], phone [phone] or [phone].  Monday - Friday, call for an appointment. [place],  [facility], [address], [place], phone [phone] or [phone].  Monday - Friday, call for an appointment.  Abuse/Neglect: [place] [phone] [place] [phone] (After Hours)  Emergency Shelter:  [facility] [phone]  Maternity Homes: Room at the [place] [phone] [place] [phone]  MRSA Hotline #:   (450)655-7293  [facility] Dept. [address]         [place] Phone:  [phone]                                  Phone:  [phone]                   Phone:  [phone]  [facility], [place]- [phone]       -      [facility] in [place], [address]                                  [place] [phone] or [phone] (After Hours)   [place]  Substance Abuse Resources: Alcohol and Drug Services  [phone] [place] [phone] The [facility] Chinita [facility] [phone] Residential & Outpatient Substance Abuse Program  [phone]  Psychological Services: [place]  [phone] [place]  [place], [place]. [address] [place], [place]: [phone] or [phone], PicCapture.uy  Dental Assistance  If unable to pay or uninsured, contact:  [facility] or [facility]. to become qualified for the adult dental clinic.  Patients with Medicaid: [facility] [phone], [place] [address], [phone]  If unable to pay, or uninsured, contact [facility] [phone]) or [place] [phone] in [place], [place] in [facility]) to become qualified for the adult dental clinic  Other [place]- [place], [place], [place], [address], [place], [place], 2nd and 4th Thursday of the month at 6:30am.  10 clients each day by appointment, can sometimes see walk-in patients if someone does not show for an appointment. [facility]- [address] Hillard Danker [place], [place], [address], [place], [place], [place], [address], [place] [facility] [phone]  Please make every effort to establish with a primary care physician for routine medical care  Adult Health Services  The [place] provides a  wide range of adult health services. Some of these services are designed to address the healthcare needs of all [facility] residents and all services are designed to meet the needs of uninsured/underinsured low income residents. Some services are available to any resident of [place], call [phone] for details. ] The [facility], a new medical clinic for adults, is now open. For more information about the Center and its services please call [phone]. For information on our [place] services, click here.  For more information on any of the following Department of Public Health programs, including hours of service, click on the highlighted link.  SERVICES FOR WOMEN (Adults and Teens) [employer] provide a full range of birth control options plus education and counseling. New patient visit and annual return visits include a complete examination, pap test as indicated, and other laboratory as indicated. Included is our [employer] for men.  Maternity Care is provided through pregnancy, including a six week post partum exam. Women who meet eligibility criteria for the Medicaid for Pregnant Women program, receive care free. Other women are charged on a sliding scale according to income. Note: [facility] provides services to pregnant women who have a Medicaid card. Call [phone] for an appointment in [place] or [phone] for an appointment in [facility].  Primary Care for Medicaid [place] Access Women is available through the [address]. As primary care provider for the [place] program, women may designate the [facility] clinic as their primary care provider.  PLEASE CALL [phone] FOR AN APPOINTMENT FOR THE ABOVE SERVICES IN EITHER [place] OR [place]. Information available in [place] and  [place].   Childbirth Education Classes are open to the public and offered to help families prepare for the best possible childbirth experience as well as to promote lifelong health and wellness. Classes are offered throughout the year and meet on the same night once a week for five weeks. Medicaid covers the cost of the classes for the mother-to-be and her partner. For participants without Medicaid, the cost of the class series is $45.00 for the mother-to-be and her partner. Class size is limited and registration is required. For more information or to register call [phone]. Baby items donated by [employer] and the [employer] [place] are given away during each class series.  SERVICES FOR WOMEN AND MEN Sexually Transmitted Infection appointments, including HIV testing, are available daily (weekdays, except holidays). Call early as same-day appointments are limited. For an appointment in either [facility] or [place], call [phone]. Services are confidential and free of charge.  Skin Testing for Tuberculosis Please call [phone]. Adult Immunizations are available, usually for a fee. Please call [phone] for details.  PLEASE CALL [phone] FOR AN APPOINTMENT FOR THE ABOVE SERVICES IN EITHER [place] OR [place].   [facility] provides up to the minute recommended vaccines for your travel destination. We also provide essential health and political information to help insure a safe and pleasurable travel experience. This program is self-sustaining, however, fees are very competitive. We are a CERTIFIED YELLOW FEVER IMMUNIZATION approved clinic site. PLEASE CALL [phone] FOR AN APPOINTMENT IN EITHER  OR [place].   If you have questions about the services listed above, we want to answer them! Email [place] at: [email or web address] Home Visiting Services for elderly and the disabled are available to residents of [facility] who are in need of care that  compares to the care offered by a nursing home, have needs that can be met by the program, and have [place] Medicaid. Other short term services are available to residents 18 years and older who are unable to meet requirements for eligibility to receive services from a certified home health agency, spend the majority of time at home, and need care for six months or less.  PLEASE CALL [phone] OR [phone] FOR MORE INFORMATION. Medication Assistance Program serves as a link between pharmaceutical companies and patients to provide low cost or free prescription medications. This servce is available for residents who meet certain income restrictions and have no insurance coverage.  PLEASE CALL [phone] ([place]) OR [phone] ([place]) FOR MORE INFORMATION.  Updated Feb. 21, 2013

## 2013-09-10 NOTE — ED Provider Notes (Signed)
Medical screening examination/treatment/procedure(s) were performed by non-physician practitioner and as supervising physician I was immediately available for consultation/collaboration.   EKG Interpretation None        Tison Leibold B. Bernette MayersSheldon, MD 09/10/13 2340

## 2013-09-11 LAB — RAPID URINE DRUG SCREEN, HOSP PERFORMED
Amphetamines: NOT DETECTED
BARBITURATES: NOT DETECTED
BENZODIAZEPINES: NOT DETECTED
Cocaine: NOT DETECTED
Opiates: POSITIVE — AB
Tetrahydrocannabinol: POSITIVE — AB

## 2016-06-03 ENCOUNTER — Encounter (HOSPITAL_BASED_OUTPATIENT_CLINIC_OR_DEPARTMENT_OTHER): Payer: Self-pay | Admitting: *Deleted

## 2016-06-03 ENCOUNTER — Emergency Department (HOSPITAL_BASED_OUTPATIENT_CLINIC_OR_DEPARTMENT_OTHER)
Admission: EM | Admit: 2016-06-03 | Discharge: 2016-06-03 | Disposition: A | Discharge status: Home / Self Care | Payer: Worker's Compensation | Attending: Emergency Medicine | Admitting: Emergency Medicine

## 2016-06-03 DIAGNOSIS — J45909 Unspecified asthma, uncomplicated: Secondary | ICD-10-CM | POA: Insufficient documentation

## 2016-06-03 DIAGNOSIS — K029 Dental caries, unspecified: Secondary | ICD-10-CM

## 2016-06-03 DIAGNOSIS — Z79899 Other long term (current) drug therapy: Secondary | ICD-10-CM | POA: Insufficient documentation

## 2016-06-03 DIAGNOSIS — Z791 Long term (current) use of non-steroidal anti-inflammatories (NSAID): Secondary | ICD-10-CM | POA: Insufficient documentation

## 2016-06-03 DIAGNOSIS — F17210 Nicotine dependence, cigarettes, uncomplicated: Secondary | ICD-10-CM | POA: Insufficient documentation

## 2016-06-03 HISTORY — DX: Other chronic pain: G89.29

## 2016-06-03 HISTORY — DX: Dorsalgia, unspecified: M54.9

## 2016-06-03 MED ORDER — OXYCODONE-ACETAMINOPHEN 5-325 MG PO TABS: 1.0000 | ORAL_TABLET | Freq: Four times a day (QID) | ORAL | 0 refills | Status: DC | PRN

## 2016-06-03 MED ORDER — AMOXICILLIN-POT CLAVULANATE 875-125 MG PO TABS: 1.0000 | ORAL_TABLET | Freq: Two times a day (BID) | ORAL | 0 refills | Status: DC

## 2016-06-03 NOTE — ED Triage Notes (Signed)
Pt reports recurrent R lower dental pain x2days -- pt reports [facility] [place] and has an appt scheduled for 06/13/16. Denies, n/v/d.

## 2016-06-03 NOTE — ED Provider Notes (Signed)
[facility]-EMERGENCY [facility]PT [facility] Provider Note   CSN: [id number] Arrival date & time: 06/03/16  0807     History   Chief Complaint Chief Complaint  Patient presents with  . Dental Pain    HPI Jesus Glass is a 46 y.o. male hx of chronic pain, dental problems here with R lower toothache for the last 2 days. States that he has poor dentition and [facility]. Worsening R jaw and dental pain for 2 days. Denies fevers. States that face is more swollen.   The history is provided by the patient.    Past Medical History:  Diagnosis Date  . Asthma   . Chronic back pain   . Heroin addiction (HCC)     There are no active problems to display for this patient.   Past Surgical History:  Procedure Laterality Date  . HAND SURGERY         Home Medications    Prior to Admission medications   Medication Sig Start Date End Date Taking? Authorizing Provider  ibuprofen (ADVIL,MOTRIN) 200 MG tablet Take 400-600 mg by mouth daily.   Yes Historical Provider, MD  Multiple Vitamins-Minerals (MULTIVITAMIN ADULT PO) Take by mouth.   Yes Historical Provider, MD  cloNIDine (CATAPRES) 0.2 MG tablet Take 1 tablet (0.2 mg total) by mouth 2 (two) times daily. 09/10/13   Tiffany Neva SeatGreene, PA-C  ondansetron (ZOFRAN) 4 MG tablet Take 1 tablet (4 mg total) by mouth every 6 (six) hours. 09/10/13   Tiffany Neva SeatGreene, PA-C  oxyCODONE-acetaminophen (PERCOCET) 10-325 MG per tablet Take 4 tablets by mouth every 4 (four) hours as needed for pain.    Historical Provider, MD    Family History No family history on file.  Social History Social History  Substance Use Topics  . Smoking status: Current Every Day Smoker    Packs/day: 0.50    Types: Cigarettes  . Smokeless tobacco: Never Used  . Alcohol use No     Allergies   Hydrocodone and Tramadol   Review of Systems Review of Systems  HENT:       Toothache   All other systems reviewed and are  negative.    Physical Exam Updated Vital Signs BP 116/86 (BP Location: Left Arm)   Pulse 82   Temp 98.4 F (36.9 C) (Oral)   Ht 5\' 11"  (1.803 m)   Wt 180 lb (81.6 kg)   SpO2 99%   BMI 25.10 kg/m   Physical Exam  Constitutional:  Uncomfortable   HENT:  Head: Normocephalic.  Poor dentition overall. R lower canine there is dental caries. Some swelling in the R lower gum but no obvious periapical abscess. + R submandibular lymphadenopathy. No obvious R mid face swelling. Uvula midline, no soft palate or tongue swelling, floor of mouth soft and not firm   Eyes: Pupils are equal, round, and reactive to light.  Neck: Normal range of motion.  Cardiovascular: Normal rate, regular rhythm and normal heart sounds.   Pulmonary/Chest: Effort normal and breath sounds normal. No respiratory distress. He has no wheezes.  Abdominal: Soft. Bowel sounds are normal.  Musculoskeletal: Normal range of motion.  Neurological: He is alert.  Skin: Skin is warm.  Nursing note and vitals reviewed.    ED Treatments / Results  Labs (all labs ordered are listed, but only abnormal results are displayed) Labs Reviewed - No data to display  EKG  EKG Interpretation None       Radiology  No results found.  Procedures Procedures (including critical care time)  Medications Ordered in ED Medications - No data to display   Initial Impression / Assessment and Plan / ED Course  I have reviewed the triage vital signs and the nursing notes.  Pertinent labs & imaging results that were available during my care of the patient were reviewed by me and considered in my medical decision making (see chart for details).  Clinical Course     Jesus Glass is a 46 y.o. male here with R lower dental pain. Has poor dentition and some R lower jaw swelling. No obvious abscess. Afebrile, well appearing, no signs of PTA or RPA or ludwig's. Has dental follow up. Will dc home with augmentin, short course of  percocet for pain.   Final Clinical Impressions(s) / ED Diagnoses   Final diagnoses:  None    New Prescriptions New Prescriptions   No medications on file     Charlynne Pander, MD 06/03/16 [phone]

## 2016-06-03 NOTE — Discharge Instructions (Signed)
Take augmentin twice daily for a week.   Take motrin for pain.   Take percocet for severe pain. DO NOT drive with it.   See your dentist  Return to ER if you have worse swelling, trouble swallowing or breathing, fevers.

## 2016-08-01 ENCOUNTER — Emergency Department (HOSPITAL_BASED_OUTPATIENT_CLINIC_OR_DEPARTMENT_OTHER)
Admission: EM | Admit: 2016-08-01 | Discharge: 2016-08-01 | Disposition: A | Discharge status: Home / Self Care | Payer: Worker's Compensation | Attending: Emergency Medicine | Admitting: Emergency Medicine

## 2016-08-01 ENCOUNTER — Encounter (HOSPITAL_BASED_OUTPATIENT_CLINIC_OR_DEPARTMENT_OTHER): Payer: Self-pay | Admitting: *Deleted

## 2016-08-01 DIAGNOSIS — K0889 Other specified disorders of teeth and supporting structures: Secondary | ICD-10-CM

## 2016-08-01 DIAGNOSIS — K047 Periapical abscess without sinus: Secondary | ICD-10-CM | POA: Insufficient documentation

## 2016-08-01 DIAGNOSIS — J45909 Unspecified asthma, uncomplicated: Secondary | ICD-10-CM | POA: Insufficient documentation

## 2016-08-01 DIAGNOSIS — F17210 Nicotine dependence, cigarettes, uncomplicated: Secondary | ICD-10-CM | POA: Insufficient documentation

## 2016-08-01 MED ORDER — IBUPROFEN 600 MG PO TABS: 600.0000 mg | ORAL_TABLET | Freq: Four times a day (QID) | ORAL | 0 refills | Status: DC | PRN

## 2016-08-01 MED ORDER — ACETAMINOPHEN 500 MG PO TABS: 500.0000 mg | ORAL_TABLET | Freq: Four times a day (QID) | ORAL | 0 refills | Status: DC | PRN

## 2016-08-01 MED ORDER — AMOXICILLIN-POT CLAVULANATE 875-125 MG PO TABS: 1.0000 | ORAL_TABLET | Freq: Two times a day (BID) | ORAL | 0 refills | Status: DC

## 2016-08-01 NOTE — ED Provider Notes (Signed)
[facility] Provider Note   CSN: [id number] Arrival date & time: 08/01/16  1249     History   Chief Complaint Chief Complaint  Patient presents with  . Dental Pain    HPI Jesus Glass is a 46 y.o. male with history of poor dentition and chronic dental pain who presents with right lower dental pain that has been intermittently bothering him for a few months. [facility]. He called there this morning and was told to be seen for antibiotics and pain medicine, and that they would take care of the rest on Friday including his abscess. Patient denies any fevers or other symptoms.  HPI  Past Medical History:  Diagnosis Date  . Asthma   . Chronic back pain   . Heroin addiction (HCC)     There are no active problems to display for this patient.   Past Surgical History:  Procedure Laterality Date  . HAND SURGERY         Home Medications    Prior to Admission medications   Medication Sig Start Date End Date Taking? Authorizing Provider  acetaminophen (TYLENOL) 500 MG tablet Take 1 tablet (500 mg total) by mouth every 6 (six) hours as needed. 08/01/16   Jesus HolesAlexandra M Spyridon Hornstein, PA-C  amoxicillin-clavulanate (AUGMENTIN) 875-125 MG tablet Take 1 tablet by mouth every 12 (twelve) hours. 08/01/16   Jesus HolesAlexandra M Candler Ginsberg, PA-C  cloNIDine (CATAPRES) 0.2 MG tablet Take 1 tablet (0.2 mg total) by mouth 2 (two) times daily. 09/10/13   Tiffany Neva SeatGreene, PA-C  ibuprofen (ADVIL,MOTRIN) 600 MG tablet Take 1 tablet (600 mg total) by mouth every 6 (six) hours as needed. 08/01/16   Jesus HolesAlexandra M Caleen Taaffe, PA-C  Multiple Vitamins-Minerals (MULTIVITAMIN ADULT PO) Take by mouth.    Historical Provider, MD  ondansetron (ZOFRAN) 4 MG tablet Take 1 tablet (4 mg total) by mouth every 6 (six) hours. 09/10/13   Tiffany Neva SeatGreene, PA-C  oxyCODONE-acetaminophen (PERCOCET) 5-325 MG tablet Take 1 tablet by mouth every 6 (six) hours as needed. 06/03/16    Charlynne Panderavid Hsienta Yao, MD    Family History No family history on file.  Social History Social History  Substance Use Topics  . Smoking status: Current Every Day Smoker    Packs/day: 0.50    Types: Cigarettes  . Smokeless tobacco: Never Used  . Alcohol use No     Allergies   Hydrocodone and Tramadol   Review of Systems Review of Systems  Constitutional: Negative for fever.  HENT: Positive for dental problem.      Physical Exam Updated Vital Signs BP 124/96   Pulse 72   Temp 98.8 F (37.1 C) (Oral)   Resp 20   Ht 5\' 11"  (1.803 m)   Wt 81.6 kg   SpO2 98%   BMI 25.10 kg/m   Physical Exam  Constitutional: He appears well-developed and well-nourished. No distress.  HENT:  Head: Normocephalic and atraumatic.  Mouth/Throat: Oropharynx is clear and moist. Dental abscesses present. No oropharyngeal exudate, posterior oropharyngeal edema, posterior oropharyngeal erythema or tonsillar abscesses.    Eyes: Conjunctivae are normal. Pupils are equal, round, and reactive to light. Right eye exhibits no discharge. Left eye exhibits no discharge. No scleral icterus.  Neck: Normal range of motion. Neck supple. No thyromegaly present.  Cardiovascular: Normal rate, regular rhythm, normal heart sounds and intact distal pulses.  Exam reveals no gallop and no friction rub.   No murmur  heard. Pulmonary/Chest: Effort normal and breath sounds normal. No stridor. No respiratory distress. He has no wheezes. He has no rales.  Abdominal: Soft. Bowel sounds are normal. He exhibits no distension. There is no tenderness. There is no rebound and no guarding.  Musculoskeletal: He exhibits no edema.  Lymphadenopathy:    He has no cervical adenopathy.  Neurological: He is alert. Coordination normal.  Skin: Skin is warm and dry. No rash noted. He is not diaphoretic. No pallor.  Psychiatric: He has a normal mood and affect.  Nursing note and vitals reviewed.    ED Treatments / Results  Labs (all  labs ordered are listed, but only abnormal results are displayed) Labs Reviewed - No data to display  EKG  EKG Interpretation None       Radiology No results found.  Procedures Procedures (including critical care time)  Medications Ordered in ED Medications - No data to display   Initial Impression / Assessment and Plan / ED Course  I have reviewed the triage vital signs and the nursing notes.  Pertinent labs & imaging results that were available during my care of the patient were reviewed by me and considered in my medical decision making (see chart for details).     Patient with dentalgia. Patient with probable abscess to right lower buccal area as described above. Patient offered incision and drainage, however considering follow-up with dentist on Friday, patient declined. Will treat with penicillin and ibuprofen. Exam not concerning for Ludwig's angina or pharyngeal abscess.  Discussed return precautions. Patient understands and agrees with plan. Patient vitals stable throughout ED course discharged in satisfactory condition. Patient also evaluated by Dr. Ethelda Chick who the patient's management and agrees with plan.   Final Clinical Impressions(s) / ED Diagnoses   Final diagnoses:  Pain, dental    New Prescriptions New Prescriptions   ACETAMINOPHEN (TYLENOL) 500 MG TABLET    Take 1 tablet (500 mg total) by mouth every 6 (six) hours as needed.   IBUPROFEN (ADVIL,MOTRIN) 600 MG TABLET    Take 1 tablet (600 mg total) by mouth every 6 (six) hours as needed.     Jesus Holes, PA-C 08/01/16 1443    Jesus Sou, MD 08/01/16 1534

## 2016-08-01 NOTE — Discharge Instructions (Signed)
Medications: Augmentin, ibuprofen, Tylenol  Treatment: Take Augmentin twice daily for 1 week. Take ibuprofen every 6 hours as needed for your pain. You can alternate with Tylenol every 6 hours with 3 hours in between ibuprofen and Tylenol.  Follow-up: Please follow-up at  your dental appointment on Friday. Please return to emergency department if you develop any new or worsening symptoms.

## 2016-08-01 NOTE — ED Triage Notes (Signed)
Dental pain. 

## 2016-08-01 NOTE — ED Provider Notes (Signed)
Patient with dental pain. Has dental extractions scheduled for 08/04/2016. On exam alert nontoxic. No trismus. Speech is clear. Not ill-appearing   Doug SouSam Sinai Mahany, MD 08/01/16 1438

## 2016-12-13 ENCOUNTER — Inpatient Hospital Stay (HOSPITAL_COMMUNITY)
Admission: EM | Admit: 2016-12-13 | Discharge: 2016-12-13 | DRG: 917 | Discharge status: Against Medical Advice | Payer: Worker's Compensation | Attending: Internal Medicine | Admitting: Internal Medicine

## 2016-12-13 ENCOUNTER — Encounter (HOSPITAL_COMMUNITY): Payer: Self-pay | Admitting: Nurse Practitioner

## 2016-12-13 ENCOUNTER — Emergency Department (HOSPITAL_COMMUNITY): Payer: Worker's Compensation

## 2016-12-13 DIAGNOSIS — M549 Dorsalgia, unspecified: Secondary | ICD-10-CM | POA: Diagnosis present

## 2016-12-13 DIAGNOSIS — Z888 Allergy status to other drugs, medicaments and biological substances status: Secondary | ICD-10-CM

## 2016-12-13 DIAGNOSIS — J690 Pneumonitis due to inhalation of food and vomit: Secondary | ICD-10-CM | POA: Diagnosis present

## 2016-12-13 DIAGNOSIS — F17210 Nicotine dependence, cigarettes, uncomplicated: Secondary | ICD-10-CM | POA: Diagnosis present

## 2016-12-13 DIAGNOSIS — R000 Tachycardia, unspecified: Secondary | ICD-10-CM

## 2016-12-13 DIAGNOSIS — J9601 Acute respiratory failure with hypoxia: Secondary | ICD-10-CM | POA: Diagnosis present

## 2016-12-13 DIAGNOSIS — Z885 Allergy status to narcotic agent status: Secondary | ICD-10-CM

## 2016-12-13 DIAGNOSIS — G8929 Other chronic pain: Secondary | ICD-10-CM | POA: Diagnosis present

## 2016-12-13 DIAGNOSIS — R55 Syncope and collapse: Secondary | ICD-10-CM

## 2016-12-13 DIAGNOSIS — T401X1A Poisoning by heroin, accidental (unintentional), initial encounter: Principal | ICD-10-CM | POA: Diagnosis present

## 2016-12-13 DIAGNOSIS — T40601A Poisoning by unspecified narcotics, accidental (unintentional), initial encounter: Secondary | ICD-10-CM | POA: Diagnosis present

## 2016-12-13 DIAGNOSIS — F1120 Opioid dependence, uncomplicated: Secondary | ICD-10-CM | POA: Diagnosis present

## 2016-12-13 DIAGNOSIS — K0889 Other specified disorders of teeth and supporting structures: Secondary | ICD-10-CM

## 2016-12-13 DIAGNOSIS — T17908A Unspecified foreign body in respiratory tract, part unspecified causing other injury, initial encounter: Secondary | ICD-10-CM

## 2016-12-13 LAB — CBC WITH DIFFERENTIAL/PLATELET
BASOS ABS: 0 10*3/uL (ref 0.0–0.1)
Basophils Relative: 0 %
EOS PCT: 0 %
Eosinophils Absolute: 0 10*3/uL (ref 0.0–0.7)
HEMATOCRIT: 46.6 % (ref 39.0–52.0)
HEMOGLOBIN: 16.7 g/dL (ref 13.0–17.0)
LYMPHS ABS: 1.1 10*3/uL (ref 0.7–4.0)
LYMPHS PCT: 10 %
MCH: 33.1 pg (ref 26.0–34.0)
MCHC: 35.8 g/dL (ref 30.0–36.0)
MCV: 92.3 fL (ref 78.0–100.0)
Monocytes Absolute: 0.4 10*3/uL (ref 0.1–1.0)
Monocytes Relative: 3 %
NEUTROS ABS: 9.4 10*3/uL — AB (ref 1.7–7.7)
NEUTROS PCT: 87 %
PLATELETS: 122 10*3/uL — AB (ref 150–400)
RBC: 5.05 MIL/uL (ref 4.22–5.81)
RDW: 15.1 % (ref 11.5–15.5)
WBC: 11 10*3/uL — AB (ref 4.0–10.5)

## 2016-12-13 LAB — COMPREHENSIVE METABOLIC PANEL
ALK PHOS: 111 U/L (ref 38–126)
ALT: 101 U/L — ABNORMAL HIGH (ref 17–63)
ANION GAP: 9 (ref 5–15)
AST: 69 U/L — ABNORMAL HIGH (ref 15–41)
Albumin: 4.4 g/dL (ref 3.5–5.0)
BUN: 21 mg/dL — ABNORMAL HIGH (ref 6–20)
CALCIUM: 8.8 mg/dL — AB (ref 8.9–10.3)
CHLORIDE: 99 mmol/L — AB (ref 101–111)
CO2: 27 mmol/L (ref 22–32)
CREATININE: 1.1 mg/dL (ref 0.61–1.24)
Glucose, Bld: 96 mg/dL (ref 65–99)
Potassium: 5 mmol/L (ref 3.5–5.1)
SODIUM: 135 mmol/L (ref 135–145)
Total Bilirubin: 1 mg/dL (ref 0.3–1.2)
Total Protein: 8.5 g/dL — ABNORMAL HIGH (ref 6.5–8.1)

## 2016-12-13 LAB — URINALYSIS, ROUTINE W REFLEX MICROSCOPIC
Bilirubin Urine: NEGATIVE
Glucose, UA: NEGATIVE mg/dL
HGB URINE DIPSTICK: NEGATIVE
Ketones, ur: NEGATIVE mg/dL
LEUKOCYTES UA: NEGATIVE
NITRITE: NEGATIVE
PROTEIN: NEGATIVE mg/dL
SPECIFIC GRAVITY, URINE: 1.016 (ref 1.005–1.030)
pH: 5 (ref 5.0–8.0)

## 2016-12-13 LAB — RAPID URINE DRUG SCREEN, HOSP PERFORMED
Amphetamines: NOT DETECTED
BARBITURATES: NOT DETECTED
BENZODIAZEPINES: NOT DETECTED
Cocaine: NOT DETECTED
Opiates: POSITIVE — AB
Tetrahydrocannabinol: NOT DETECTED

## 2016-12-13 LAB — LACTIC ACID, PLASMA
LACTIC ACID, VENOUS: 1.7 mmol/L (ref 0.5–1.9)
LACTIC ACID, VENOUS: 1 mmol/L (ref 0.5–1.9)

## 2016-12-13 LAB — ETHANOL

## 2016-12-13 LAB — CBG MONITORING, ED: Glucose-Capillary: 95 mg/dL (ref 65–99)

## 2016-12-13 MED ORDER — NALOXONE HCL 0.4 MG/ML IJ SOLN
0.4000 mg | Freq: Once | INTRAMUSCULAR | Status: AC
  Administered 2016-12-13: 0.4 mg via INTRAVENOUS
  Filled 2016-12-13: qty 1

## 2016-12-13 MED ORDER — NALOXONE HCL 0.4 MG/ML IJ SOLN
0.4000 mg | Freq: Once | INTRAMUSCULAR | Status: AC
  Administered 2016-12-13: 0.1 mg via INTRAVENOUS
  Filled 2016-12-13: qty 1

## 2016-12-13 MED ORDER — SODIUM CHLORIDE 0.9 % IV SOLN: 3.0000 g | Freq: Four times a day (QID) | INTRAVENOUS | Status: DC

## 2016-12-13 MED ORDER — SODIUM CHLORIDE 0.9 % IV BOLUS (SEPSIS)
1000.0000 mL | Freq: Once | INTRAVENOUS | Status: AC
  Administered 2016-12-13: 1000 mL via INTRAVENOUS

## 2016-12-13 MED ORDER — ONDANSETRON HCL 4 MG/2ML IJ SOLN: 4.0000 mg | Freq: Four times a day (QID) | INTRAMUSCULAR | Status: DC | PRN

## 2016-12-13 MED ORDER — ONDANSETRON HCL 4 MG/2ML IJ SOLN
4.0000 mg | Freq: Once | INTRAMUSCULAR | Status: AC
  Administered 2016-12-13: 4 mg via INTRAVENOUS
  Filled 2016-12-13: qty 2

## 2016-12-13 MED ORDER — ENOXAPARIN SODIUM 40 MG/0.4ML ~~LOC~~ SOLN: 40.0000 mg | SUBCUTANEOUS | Status: DC

## 2016-12-13 MED ORDER — ACETAMINOPHEN 325 MG PO TABS: 650.0000 mg | ORAL_TABLET | Freq: Four times a day (QID) | ORAL | Status: DC | PRN

## 2016-12-13 MED ORDER — SODIUM CHLORIDE 0.9 % IV SOLN
3.0000 g | Freq: Once | INTRAVENOUS | Status: DC
  Filled 2016-12-13: qty 3

## 2016-12-13 MED ORDER — AMOXICILLIN-POT CLAVULANATE 875-125 MG PO TABS: 1.0000 | ORAL_TABLET | Freq: Two times a day (BID) | ORAL | 0 refills | Status: AC

## 2016-12-13 MED ORDER — NICOTINE 14 MG/24HR TD PT24: 14.0000 mg | MEDICATED_PATCH | Freq: Every day | TRANSDERMAL | Status: DC

## 2016-12-13 NOTE — Plan of Care (Signed)
Patient has decided to leave AMA.  Myself and Dr. Rush Landmarkegeler attempted unsuccessfully to convince him to stay.

## 2016-12-13 NOTE — Discharge Instructions (Signed)
Your workup today showed concern for lung injury and aspiration pneumonitis from the vomit you inhaled today. We recommended admission due to your oxygen requirement and tachycardia however, you wanted to be discharged. Our discussion together revealed you understood the risks of worsening condition and death by leaving. You accepted these risks. We are going to give you a prescription for antibiotics to help prevent developing pneumonia. You are still at risk for lung injury and worsening condition. If any symptoms change or worsen, please return immediately to the nearest emergency department for further management.

## 2016-12-13 NOTE — ED Notes (Signed)
Pts CBG 95  

## 2016-12-13 NOTE — ED Provider Notes (Addendum)
[facility] DEPT Provider Note   CSN: [id number] Arrival date & time: 12/13/16  1413     History   Chief Complaint No chief complaint on file.   HPI Jesus Glass is a 46 y.o. male with a history of Heroin addiction who presents today after reportedly being found down by his friend on the floor, snoring with vomiting his mouth. His friend reportedly gave him Narcan and he woke up. Of note he reports that he was released 2 days ago from a 90 day rehabilitation facility for drug use.  He reports that today he was having a toothache, has an appointment in August to get his teeth pulled and get dentures. He reports that he took 1 Percocet 5 this morning. He denies any other drug use.  Patient's remainder of history of present illness and ROS is limited as patient keeps falling asleep during interview.  Patient reports that he did not sleep well at rehabilitation, and is just "very tired"  He does report some shortness of breath and increased difficulty breathing. Denies headache, neck pain, muscle aches or recent illness.  HPI  Past Medical History:  Diagnosis Date  . Asthma   . Chronic back pain   . Heroin addiction [facility])     Patient Active Problem List   Diagnosis Date Noted  . Aspiration pneumonia of right lower lobe due to vomit ([place]) 12/13/2016  . Overdose of opiate or related narcotic, accidental or unintentional, initial encounter 12/13/2016  . Acute respiratory failure with hypoxia ([place]) 12/13/2016    Past Surgical History:  Procedure Laterality Date  . HAND SURGERY         Home Medications    Prior to Admission medications   Medication Sig Start Date End Date Taking? Authorizing Provider  ibuprofen (ADVIL,MOTRIN) 200 MG tablet Take 200 mg by mouth every 6 (six) hours as needed for headache, mild pain or moderate pain.   Yes [provider]  oxyCODONE-acetaminophen (PERCOCET) 5-325 MG tablet Take 1 tablet by mouth every 6 (six) hours as  needed. Patient taking differently: Take 1 tablet by mouth every 6 (six) hours as needed for severe pain.  06/03/16  Yes Drenda Freeze, MD  amoxicillin-clavulanate (AUGMENTIN) 875-125 MG tablet Take 1 tablet by mouth 2 (two) times daily. 12/13/16 12/20/16  Tegeler, Gwenyth Allegra, MD    Family History History reviewed. No pertinent family history.  Social History Social History  Substance Use Topics  . Smoking status: Current Every Day Smoker    Packs/day: 0.50    Types: Cigarettes  . Smokeless tobacco: Never Used  . Alcohol use No     Allergies   Hydrocodone and Tramadol   Review of Systems Review of Systems   Level V caveat: patient falling asleep in-between questions, altered mental status. See HPI   Physical Exam Updated Vital Signs BP (!) 107/94 (BP Location: Right Arm)   Pulse (!) 111   Temp (!) 101.2 F (38.4 C) (Oral)   Resp 18   Ht 5' 11"  (1.803 m)   Wt 83.9 kg (185 lb)   SpO2 92%   BMI 25.80 kg/m   Physical Exam  Constitutional: He appears well-developed and well-nourished. He appears lethargic. He is sleeping.  HENT:  Head: Normocephalic and atraumatic.  Right Ear: External ear normal.  Left Ear: External ear normal.  Nose: Nose normal.  Mouth/Throat: Oropharynx is clear and moist. No oropharyngeal exudate.  Eyes: Conjunctivae are normal.  Pupils pinpoint bilaterally.  Neck: Trachea normal,  normal range of motion and full passive range of motion without pain. Neck supple. No spinous process tenderness and no muscular tenderness present. No neck rigidity.  Cardiovascular: Regular rhythm, normal heart sounds and normal pulses.  Tachycardia present.   No murmur heard. Pulmonary/Chest: Effort normal. No respiratory distress.  While observing patient, multiple times he would fall asleep, start snoring and would have 10-15 seconds of apnea before he would startle awake, breathing normally and gradually fall back asleep repeating the cycle.  When patient  was awakened and interacting with patient his respiratory rate would normalize until he fell back asleep again.  Diffuse rhonchi present in bilateral middle/lower fields, worse on right.  Abdominal: Soft. Normal appearance. There is no tenderness.  Musculoskeletal: He exhibits no edema.  Bilateral upper and lower extremities palpated, no obvious deformities, all compartments are soft.  Neurological: He appears lethargic.  Patient falls asleep very easily including in between questions.  When awake he is oriented to person place and time, able to follow commands with appropriate verbal responses. His strength, and sensation are intact. No obvious facial droop or slurred speech.  Skin: Skin is warm and dry. He is not diaphoretic.  Psychiatric: He has a normal mood and affect.  Nursing note and vitals reviewed.    ED Treatments / Results  Labs (all labs ordered are listed, but only abnormal results are displayed) Labs Reviewed  COMPREHENSIVE METABOLIC PANEL - Abnormal; Notable for the following:       Result Value   Chloride 99 (*)    BUN 21 (*)    Calcium 8.8 (*)    Total Protein 8.5 (*)    AST 69 (*)    ALT 101 (*)    All other components within normal limits  RAPID URINE DRUG SCREEN, HOSP PERFORMED - Abnormal; Notable for the following:    Opiates POSITIVE (*)    All other components within normal limits  URINALYSIS, ROUTINE W REFLEX MICROSCOPIC - Abnormal; Notable for the following:    APPearance HAZY (*)    All other components within normal limits  CBC WITH DIFFERENTIAL/PLATELET - Abnormal; Notable for the following:    WBC 11.0 (*)    Platelets 122 (*)    Neutro Abs 9.4 (*)    All other components within normal limits  CULTURE, BLOOD (ROUTINE X 2)  CULTURE, BLOOD (ROUTINE X 2)  CULTURE, EXPECTORATED SPUTUM-ASSESSMENT  GRAM STAIN  ETHANOL  LACTIC ACID, PLASMA  LACTIC ACID, PLASMA  CBG MONITORING, ED    EKG  EKG Interpretation  Date/Time:  Wednesday December 13 2016  17:10:18 EDT Ventricular Rate:  115 PR Interval:    QRS Duration: 113 QT Interval:  304 QTC Calculation: 421 R Axis:   65 Text Interpretation:  Sinus Rhythm Tachycardia No prior ECG for comparison.  No STEMI Confirmed by Antony Blackbird [phone]) on 12/13/2016 5:14:14 PM       Radiology Dg Chest 2 View  Result Date: 12/13/2016 CLINICAL DATA:  Potential overdose.  Concern for aspiration. EXAM: CHEST  2 VIEW COMPARISON:  None. FINDINGS: Normal cardiac silhouette and mediastinal contours. Right infrahilar heterogeneous potential airspace opacities. No pleural effusion or pneumothorax. No evidence of edema. No acute osseus abnormalities. IMPRESSION: Right infrahilar heterogeneous opacities, potentially atelectasis though worrisome for an area of aspiration given provided history. Continued attention on follow-up is recommended. Electronically Signed   By: Sandi Mariscal M.D.   On: 12/13/2016 15:29    Procedures Procedures  CRITICAL CARE Performed by: Wyn Quaker  Total critical care time: 38 minutes Critical care time was exclusive of separately billable procedures and treating other patients. Critical care was necessary to treat or prevent imminent or life-threatening deterioration. Critical care was time spent personally by me on the following activities: development of treatment plan with patient and/or surrogate as well as nursing, discussions with consultants, evaluation of patient's response to treatment, examination of patient, obtaining history from patient or surrogate, ordering and performing treatments and interventions, ordering and review of laboratory studies, ordering and review of radiographic studies, pulse oximetry and re-evaluation of patient's condition.  Overdose requiring multiple doses of narcan, aspiration with new oxygen requirement, met sepsis criteria at time of AMA while in ED.   Medications Ordered in ED Medications  naloxone [facility]) injection 0.4 mg (0.1 mg  Intravenous Given by Other 12/13/16 1643)  sodium chloride 0.9 % bolus 1,000 mL (0 mLs Intravenous Stopped 12/13/16 1901)  naloxone (NARCAN) injection 0.4 mg (0.4 mg Intravenous Given 12/13/16 1707)  ondansetron (ZOFRAN) injection 4 mg (4 mg Intravenous Given 12/13/16 1718)     Initial Impression / Assessment and Plan / ED Course  I have reviewed the triage vital signs and the nursing notes.  Pertinent labs & imaging results that were available during my care of the patient were reviewed by me and considered in my medical decision making (see chart for details).  Clinical Course as of Dec 14 145  Wed Dec 13, 2016  1640 0.1 mg narcan given IV, patient became mildly more responsive, pupils remain pinpoint, breathing efforts/rate is remaining more stable when patient falls asleep.  While patient was awake attempted removal of oxygen after Narcan, patient desatted into the high 80s, remains on 3-4 L of oxygen at this time.  [EH]  1708 Patient mental status improved after initial bolus of narcan, patient given additional narcan 0.68m which greatly improved his alertness, and mental status. Patient has minor tremors, and says that the Narcan makes him feel weird. He reports some nausea, will give Zofran 4 mg.  [EH]  1901 Patient re-checked, sleeping but easily arousable.  [EH]  1941 Spoke with hospitalist, he requested Unasyn be started and will come see patient.    [EH]  2034 I was informed the patient wished to leave AMA and not be admitted as he "has to work tomorrow" I informed patient that he is at very high risk for complications as he has new oxygen requirements, chest x-ray consistent with aspiration, is tachycardic, hypoxic without oxygen. I bluntly explained to patient that there is a high chance that he may die and he states he understands this risk. I further informed him of that if he doesn't I he is at high risk of disability or loss of function that may range from mild to severe including  vegetative state, loss of ability to talk, walk, or feed himself. Patient states he understands this and still wishes to be discharged.    [EH]  2040 I once again had a discussion with patient attempting to convince him to stay as his discharge vitals shows that he is now febrile. I once again explained to patient that I am very concerned about his well-being and concerned that if he leaves he may worsen which could lead to disability or death. I explained to patient that he would be given a work note if he chose to stay. Patient stated his acknowledgment again of all of these risks and states that he will have his father bring him back if  he has any complications or concerns. I explained to him that by delaying his care he needs to understand that there is a chance he may worsen and even if he were to come back it may be too late to prevent death or disability. I explained to him that the medical recommendation would be for him to stay in the hospital. He once again states his understanding and wishes to be discharged.  Time of these discussions he is alert, sitting, able to walk without difficulties, he is not falling asleep during these conversations alert and oriented to person place and time, does not appear confused.  [EH]    Clinical Course User Index [EH] Lorin Glass, PA-C   Clarnce Flock presents by EMS after he was found on the floor unconscious, snoring with vomit in his mouth. Reportedly his friend gave him Narcan which woke him up. Patient adamantly denies taking more pain medication than Percocet 5, however he was recently released from rehabilitation for opioid use.  He has a new oxygen requirement requiring 3 L of oxygen. Chest x-ray reveals Right infrahilar infiltrates concerning for aspiration.  Patient required 2 doses of Narcan while in the emergency room as he would repeatedly fall asleep during which he would have 10-15 seconds of apnea. Based on requiring multiple doses  of Narcan, new oxygen requirement with chest x-ray consistent with aspiration recommend patient for admission.   Patient was initially agreeable to admission, however once hospitalist attempted to evaluate patient he stated his desire to sign out [place]. I attempted multiple times to persuade the patient to stay and receive treatment. I bluntly explained to him multiple times that by leaving in his current condition, especially as he was febrile, and requiring oxygen that he is at very high risk of death or disability that range from mild to severe, chronic pain, chronic breathing problems, or other possible complications. Patient states he understands these risks and still wishes to leave. Patient signed out [place].  At time of discharge patient appears to have the capacity to make his decision and understands the consequences of that decision. He is awake and alert, interacting appropriately and not falling asleep during questioning.  The patient was encouraged to take his discharge antibiotics and encouraged to return to the emergency room at any time for further treatment.  Final Clinical Impressions(s) / ED Diagnoses   Final diagnoses:  Tachycardia  Aspiration into airway, initial encounter  Transient unconsciousness  Pain, dental    New Prescriptions Discharge Medication List as of 12/13/2016  8:24 PM    START taking these medications   Details  amoxicillin-clavulanate (AUGMENTIN) 875-125 MG tablet Take 1 tablet by mouth 2 (two) times daily., Starting Wed 12/13/2016, Until Wed 12/20/2016, Print         Lorin Glass, PA-C 12/14/16 0155    Tegeler, Gwenyth Allegra, MD 12/14/16 1120    Lorin Glass, [place] 12/14/16 1321    Tegeler, Gwenyth Allegra, MD 12/16/16 1134

## 2016-12-13 NOTE — ED Notes (Signed)
Patient is alert, oriented x 4 and slightly diaphoretic. Patient reports he is hot from having a blanket on him.

## 2016-12-13 NOTE — ED Notes (Signed)
Assisting patient to the restroom for a urine sample. He as ambulated with a steady gait.

## 2016-12-13 NOTE — ED Notes (Signed)
Bed: [facility] Expected date:  Expected time:  Means of arrival:  Comments: 

## 2016-12-13 NOTE — Progress Notes (Signed)
Pharmacy Antibiotic Note  Jesus Glass is a 46 y.o. male with history of heroin addiction admitted on 12/13/2016 after being found down with vomitus in his mouth. .  Pharmacy has been consulted for Unasyn dosing for presumed aspiration pneumonia.  Plan:  Unasyn 3g IV q6h  No further dosing adjustment needed, Pharmacy will sign off  Height: 5\' 11"  (180.3 cm) Weight: 185 lb (83.9 kg) IBW/kg (Calculated) : 75.3  No data recorded.   Recent Labs Lab 12/13/16 1649 12/13/16 1704 12/13/16 1804  WBC  --  11.0*  --   CREATININE  --  1.10  --   LATICACIDVEN 1.7  --  1.0    Estimated Creatinine Clearance: 88.4 mL/min (by C-G formula based on SCr of 1.1 mg/dL).    Allergies  Allergen Reactions  . Hydrocodone Hives  . Tramadol Hives    Antimicrobials this admission: 7/25 Unasyn >>  Dose adjustments this admission: none  Microbiology results: 7/25 BCx:  7/25 Sputum:   Thank you for allowing pharmacy to be a part of this patient's care.  Loralee PacasErin Kevontay Burks, PharmD, BCPS Pager: [phone] 12/13/2016 7:56 PM

## 2016-12-13 NOTE — ED Notes (Signed)
Pt given discharge information encouraged to take tylenol of fever, and obtain RX Abx.Reviewed s/s that require emergent care. Pt is being DC as a AMA and understands risks. Pt verbalized understanding to all d/c information verbalized understanding.

## 2016-12-13 NOTE — ED Triage Notes (Signed)
Patient friend found him on the floor snoring with vomiting in his mouth. Friend gave him narcan and he woke up. Patient is 90% on 4L [place].  CBG 181 HR 100.

## 2017-05-25 ENCOUNTER — Other Ambulatory Visit: Payer: Self-pay | Admitting: Occupational Medicine

## 2017-05-25 ENCOUNTER — Ambulatory Visit: Payer: Self-pay

## 2017-05-25 DIAGNOSIS — M79641 Pain in right hand: Secondary | ICD-10-CM

## 2017-07-18 ENCOUNTER — Ambulatory Visit: Payer: Worker's Compensation | Admitting: Family Medicine

## 2017-07-24 ENCOUNTER — Ambulatory Visit: Payer: Self-pay | Admitting: Family Medicine

## 2017-07-27 ENCOUNTER — Ambulatory Visit: Payer: Self-pay | Admitting: Family Medicine

## 2017-07-27 DIAGNOSIS — Z0289 Encounter for other administrative examinations: Secondary | ICD-10-CM

## 2018-11-01 ENCOUNTER — Encounter (HOSPITAL_COMMUNITY): Payer: Self-pay | Admitting: Emergency Medicine

## 2018-11-01 ENCOUNTER — Emergency Department (HOSPITAL_COMMUNITY)
Admission: EM | Admit: 2018-11-01 | Discharge: 2018-11-01 | Discharge status: Against Medical Advice | Payer: Self-pay | Attending: Emergency Medicine | Admitting: Emergency Medicine

## 2018-11-01 ENCOUNTER — Other Ambulatory Visit: Payer: Self-pay

## 2018-11-01 DIAGNOSIS — M79602 Pain in left arm: Secondary | ICD-10-CM | POA: Insufficient documentation

## 2018-11-01 DIAGNOSIS — G589 Mononeuropathy, unspecified: Secondary | ICD-10-CM | POA: Insufficient documentation

## 2018-11-01 DIAGNOSIS — Z5320 Procedure and treatment not carried out because of patient's decision for unspecified reasons: Secondary | ICD-10-CM | POA: Insufficient documentation

## 2018-11-01 DIAGNOSIS — F17210 Nicotine dependence, cigarettes, uncomplicated: Secondary | ICD-10-CM | POA: Insufficient documentation

## 2018-11-01 NOTE — ED Triage Notes (Signed)
Pt. Stated, I sleep on my left arm Tuesday night and Im not able to control my left arm now. Tight grip on left hand.

## 2018-11-01 NOTE — Discharge Instructions (Addendum)
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  We would like to do an MRI of your neck at this time, as we discussed you will be leaving [place], leaving at this time we are unable to complete evaluation that could result in permanent disability or death.  Please return at any time for repeat and ongoing evaluation.

## 2018-11-01 NOTE — ED Provider Notes (Signed)
[facility]   CSN: [id number] Arrival date & time: 11/01/18  1003    History   Chief Complaint Chief Complaint  Patient presents with  . Arm Pain    numbness    HPI Jesus Glass is a 48 y.o. male.     HPI    48 year old male presents today with complaints of weakness in his left arm.  Patient notes that he sleep on his couch last night.  He notes he was asleep for approximately 6 hours.  When he woke up his arm was twisted and he noted weakness in his shoulder and elbow.  Patient notes that he cannot lift his shoulder up and cannot flex his elbow.  He has intact extension at the shoulder elbow wrist and hand.  The hand and wrist complete strength and range of motion.  He notes decreased sensation along the thumb and the proximal lateral forearm.  He denies any infections.  Denies any neck or back pain, no other neurological deficits including facial right upper or lower.  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  No fever.    Past Medical History:  Diagnosis Date  . Asthma   . Chronic back pain   . Heroin addiction [facility](HCC)     Patient Active Problem List   Diagnosis Date Noted  . Aspiration pneumonia of right lower lobe due to vomit (HCC) 12/13/2016  . Overdose of opiate or related narcotic, accidental or unintentional, initial encounter (HCC) 12/13/2016  . Acute respiratory failure with hypoxia (HCC) 12/13/2016    Past Surgical History:  Procedure Laterality Date  . HAND SURGERY          Home Medications    Prior to Admission medications   Medication Sig Start Date End Date Taking? Authorizing Provider  ibuprofen (ADVIL,MOTRIN) 200 MG tablet Take 200 mg by mouth every 6 (six) hours as needed for headache, mild pain or moderate pain.    [provider]  oxyCODONE-acetaminophen (PERCOCET) 5-325 MG tablet Take 1 tablet by mouth every 6 (six) hours as needed. Patient  taking differently: Take 1 tablet by mouth every 6 (six) hours as needed for severe pain.  06/03/16   Charlynne PanderYao, David Hsienta, MD    Family History No family history on file.  Social History Social History   Tobacco Use  . Smoking status: Current Every Day Smoker    Packs/day: 0.50    Types: Cigarettes  . Smokeless tobacco: Never Used  Substance Use Topics  . Alcohol use: No  . Drug use: No    Comment: reports he's in recovery x3 years     Allergies   Hydrocodone and Tramadol   Review of Systems Review of Systems  All other systems reviewed and are negative.    Physical Exam Updated Vital Signs BP (!) 129/92   Pulse 97   Temp 98.5 F (36.9 C) (Oral)   Resp 16   Ht 5\' 11"  (1.803 m)   Wt 83.9 kg   SpO2 100%   BMI 25.80 kg/m   Physical Exam Vitals signs and nursing note reviewed.  Constitutional:      Appearance: He is well-developed.  HENT:     Head: Normocephalic and atraumatic.  Eyes:     General: No scleral icterus.       Right eye: No discharge.        Left eye: No discharge.     Conjunctiva/sclera:  Conjunctivae normal.     Pupils: Pupils are equal, round, and reactive to light.  Neck:     Musculoskeletal: Normal range of motion.     Vascular: No JVD.     Trachea: No tracheal deviation.  Pulmonary:     Effort: Pulmonary effort is normal.     Breath sounds: No stridor.  Musculoskeletal:     Comments: Left upper extremity without swelling or edema, radial pulse 2+ cap refill intact-sensation intact except for the proximal lateral forearm and the tip of his thumb-0 out of 5 strength at the shoulder with extension, 0 out of 5 strength at the elbow with flexion-extension at the shoulder 5 out of 5 extension at the elbow wrist and hand 5 out of 5-radial medial and ulnar nerves intact strength-tricep reflex intact 2+  No CT spine tenderness palpation, no redness swelling  Joint compartments are soft  Needle marks noted throughout the left upper extremity  with no signs of infection  Neurological:     Mental Status: He is alert and oriented to person, place, and time.     Coordination: Coordination normal.     Comments: Sensation strength and motor function intact with the exception of that noted above  Psychiatric:        Behavior: Behavior normal.        Thought Content: Thought content normal.        Judgment: Judgment normal.      ED Treatments / Results  Labs (all labs ordered are listed, but only abnormal results are displayed) Labs Reviewed - No data to display  EKG None  Radiology No results found.  Procedures Procedures (including critical care time)  Medications Ordered in ED Medications - No data to display   Initial Impression / Assessment and Plan / ED Course  I have reviewed the triage vital signs and the nursing notes.  Pertinent labs & imaging results that were available during my care of the patient were reviewed by me and considered in my medical decision making (see chart for details).        Pleasant 48 year old male presents today with upper extremity weakness.  His presentation is interesting in that he has several nerves involved, question axillary, musculocutaneous.  Discussed the case with on-call neurologist Dr. Leonel Ramsay who recommends MRI without contrast of the cervical spine given the distribution of his weakness.  I discussed this with the patient, he reports he has something to do and cannot stay he ensures that he will return for further evaluation.  He understands the risks of not completing his evaluation including death and disability.  Patient will sign out [place] and reports he will be back this afternoon.  The patient does return and has no changes in his baseline my original plan was to get the MRI of his cervical spine if no significant abnormalities were noted he would be stable for outpatient management with neurology with no further intervention at this time.   Final Clinical Impressions(s) / ED Diagnoses   Final diagnoses:  Nerve palsy    ED Discharge Orders    None       Francee Gentile 11/01/18 1217    Noemi Chapel, MD 11/02/18 [phone]

## 2019-05-20 ENCOUNTER — Encounter (HOSPITAL_COMMUNITY): Payer: Self-pay

## 2019-05-20 ENCOUNTER — Emergency Department (HOSPITAL_COMMUNITY)
Admission: EM | Admit: 2019-05-20 | Discharge: 2019-05-20 | Disposition: A | Discharge status: Home / Self Care | Payer: Self-pay | Attending: Emergency Medicine | Admitting: Emergency Medicine

## 2019-05-20 DIAGNOSIS — L02413 Cutaneous abscess of right upper limb: Secondary | ICD-10-CM | POA: Insufficient documentation

## 2019-05-20 DIAGNOSIS — Z5321 Procedure and treatment not carried out due to patient leaving prior to being seen by health care provider: Secondary | ICD-10-CM | POA: Insufficient documentation

## 2019-05-20 NOTE — ED Triage Notes (Signed)
Pt has an abscess on his right forearm. Pt states that it has gotten smaller, but wants it evaluated.

## 2020-02-17 ENCOUNTER — Emergency Department (HOSPITAL_COMMUNITY): Admission: EM | Admit: 2020-02-17 | Discharge: 2020-02-17 | Discharge status: Against Medical Advice | Payer: Self-pay

## 2020-02-29 ENCOUNTER — Encounter (HOSPITAL_COMMUNITY): Payer: Self-pay

## 2020-02-29 ENCOUNTER — Other Ambulatory Visit: Payer: Self-pay

## 2020-02-29 ENCOUNTER — Emergency Department (HOSPITAL_COMMUNITY)
Admission: EM | Admit: 2020-02-29 | Discharge: 2020-03-01 | Disposition: A | Discharge status: Home / Self Care | Payer: Medicaid Other | Attending: Emergency Medicine | Admitting: Emergency Medicine

## 2020-02-29 DIAGNOSIS — Z5321 Procedure and treatment not carried out due to patient leaving prior to being seen by health care provider: Secondary | ICD-10-CM | POA: Insufficient documentation

## 2020-02-29 DIAGNOSIS — R059 Cough, unspecified: Secondary | ICD-10-CM | POA: Insufficient documentation

## 2020-02-29 DIAGNOSIS — R439 Unspecified disturbances of smell and taste: Secondary | ICD-10-CM | POA: Insufficient documentation

## 2020-02-29 NOTE — ED Triage Notes (Signed)
Onset 1 week cough, loss of taste and smell.  No respiratory distress noted at triage.

## 2020-03-01 NOTE — ED Notes (Signed)
Pt left waiting room for the 3rd time.

## 2020-03-01 NOTE — ED Notes (Signed)
Pt rechecked in with registration and registration added him back on the floor.

## 2020-03-01 NOTE — ED Notes (Addendum)
Pt got in car and left.

## 2020-10-19 ENCOUNTER — Ambulatory Visit (HOSPITAL_COMMUNITY): Payer: Self-pay | Admitting: Behavioral Health

## 2020-10-19 ENCOUNTER — Other Ambulatory Visit: Payer: Self-pay

## 2020-10-25 ENCOUNTER — Ambulatory Visit (HOSPITAL_COMMUNITY): Payer: No Payment, Other | Admitting: Behavioral Health

## 2020-10-25 ENCOUNTER — Other Ambulatory Visit: Payer: Self-pay

## 2020-10-27 ENCOUNTER — Ambulatory Visit (HOSPITAL_COMMUNITY): Payer: No Payment, Other | Admitting: Behavioral Health

## 2020-10-29 ENCOUNTER — Ambulatory Visit (INDEPENDENT_AMBULATORY_CARE_PROVIDER_SITE_OTHER): Payer: Self-pay | Admitting: Licensed Clinical Social Worker

## 2020-10-29 DIAGNOSIS — F1120 Opioid dependence, uncomplicated: Secondary | ICD-10-CM | POA: Diagnosis not present

## 2020-10-31 NOTE — Progress Notes (Signed)
    Daily Group Progress Note  Program: CD-IOP   Group Time: 9am-10:30am  Participation Level: Active  Behavioral Response: Appropriate and Sharing  Type of Therapy: Process Group  Topic: Clinician checked in with group members, assessing for SI/HI/psychosis and overall level of functioning, including cravings or triggers and coping skills used to deal with uncomfortable feelings. Clinician inquired about attendance at community support meetings and connection with sober support system. Clinician and group members discussed what went well and challenges to sobriety. Clinician facilitated group processing of triggers and relapses over the weekend and common struggles in early sobriety, such as wanting the instant gratification, acceptance of physical/mental/emotional states sober in comparison to expectations and related disappointments or resentments   Group Time: 10:30am-12pm  Participation Level: None  Behavioral Response:  NA  Type of Therapy: Psycho-education Group  Topic: Clinician presented the psycho-educational topic of Adult Children of Alcoholics (ACOA). Clinician reviewed with clients the 'Laundry List' and common thought and behavior patterns of families with substance use in the home.    Summary: Client presented late, fully oriented, did not appear psychotic or intoxicated at the time of session. Client maintained sobriety date of 01/13/20 following discharge from treatment of DART in April 2022. Client processed stressors with the effect of probation restrictions and drug court on work and home life with new born child. Client left prior to psycho-education group due to family emergency.   Family Program: Family present? No   Name of family member(s): NA  UDS collected: No Results: NA  AA/NA attended?: No  Sponsor?: No   Harlon Ditty, LCSW

## 2020-11-01 ENCOUNTER — Other Ambulatory Visit: Payer: Self-pay

## 2020-11-01 ENCOUNTER — Ambulatory Visit (INDEPENDENT_AMBULATORY_CARE_PROVIDER_SITE_OTHER): Payer: Self-pay | Admitting: Licensed Clinical Social Worker

## 2020-11-01 DIAGNOSIS — F1120 Opioid dependence, uncomplicated: Secondary | ICD-10-CM | POA: Diagnosis not present

## 2020-11-02 NOTE — Progress Notes (Signed)
    Daily Group Progress Note  Program: CD-IOP   Group Time: 9am-10:30am  Participation Level: Active  Behavioral Response: Appropriate  Type of Therapy: Process Group  Topic: Clinician checked in with group members, assessing for SI/HI/psychosis and overall level of functioning, including cravings or triggers and coping skills used to deal with uncomfortable feelings. Clinician inquired about attendance at community support meetings and connection with sober support system. Clinician and group members discussed what went well and challenges to sobriety. Clinician facilitated group processing of triggers and relapses over the weekend and common struggles in early sobriety.   Group Time: 10:30am-12pm  Participation Level: Active  Behavioral Response: Appropriate and Sharing  Type of Therapy: Psycho-education Group  Topic: Psycho-educational group facilitated by Pharmacist from [facility]. Pharmacist provided information on medications, side effects, and interactions. Clients were provided with time to as questions about medications. Pharmacist provided feedback on medications to help with cravings and encouraged the need for behavioral intervention in addition to medication changes. Group reviewed possible negative side effects of THC vs CBD product use and potential for abuse of substances other than drug of choice. Group discussed ways to advocate for self when meeting with doctors to avoid potentially addictive medications. Clinician inquired a self-care activity to support recovery to be completed prior to next session.    Summary: Client showed progress toward goal of maintaining sobriety AEB sobriety date remaining 01/13/20 and attending 4 NA meetings since last session. Client reports continued struggle with family stressors and skills used to address. Client noted 0/10 anxiety or depression, 10 being worst. Client actively engaged in discussions and was receptive  to psycho-education. Client has goal of compliance with legal involvement and finding employment.   Family Program: Family present? No   Name of family member(s): NA  UDS collected: No Results: NA  AA/NA attended?: Yes     Harlon Ditty, LCSW

## 2020-11-03 ENCOUNTER — Other Ambulatory Visit: Payer: Self-pay

## 2020-11-03 ENCOUNTER — Ambulatory Visit (INDEPENDENT_AMBULATORY_CARE_PROVIDER_SITE_OTHER): Payer: No Payment, Other | Admitting: Licensed Clinical Social Worker

## 2020-11-03 DIAGNOSIS — F1121 Opioid dependence, in remission: Secondary | ICD-10-CM | POA: Diagnosis not present

## 2020-11-05 ENCOUNTER — Ambulatory Visit (INDEPENDENT_AMBULATORY_CARE_PROVIDER_SITE_OTHER): Payer: No Payment, Other | Admitting: Licensed Clinical Social Worker

## 2020-11-05 ENCOUNTER — Other Ambulatory Visit: Payer: Self-pay

## 2020-11-05 DIAGNOSIS — F1121 Opioid dependence, in remission: Secondary | ICD-10-CM | POA: Diagnosis not present

## 2020-11-08 ENCOUNTER — Ambulatory Visit (HOSPITAL_COMMUNITY): Payer: No Payment, Other | Admitting: Behavioral Health

## 2020-11-08 NOTE — Progress Notes (Signed)
    Daily Group Progress Note  Program: CD-IOP   Group Time: 9am-10:30am  Participation Level: Active  Behavioral Response: Appropriate  Type of Therapy: Process Group  Topic: Clinician checked in with group members, assessed for SI/HI/psychosis and overall level of functioning including continued cravings or use. Clinician facilitated processing changing automatic thinking from addicted brain to rational thinking. Clinician and group members discussed the effect of modeling behaviors from family of origin, including identification and coping with uncomfortable feelings on addiction. Clinician and group members processed the importance of pairing consequences with craving thoughts, including utilizing opposite action and the phrase "yet."    Group Time: 10:30am-12pm  Participation Level: Active  Behavioral Response: Appropriate  Type of Therapy: Psycho-education Group  Topic: Clinician provided mindfulness activity in session for client practice. Clinician presented the psycho-education topic of Guilt and Shame. Clinician presented Dewain Penning video 'Listening to Shame.' Clinician and group members processed thoughts, feelings, behaviors, and previous events related to Guilt, Toxic Guilt, and Shame. Clinician provided active listening, summarizing statements and validated client feelings. Clinician presented the option to replace 'I'm sorry' when shaming with 'Thank you for.' to avoid inappropriate guilt with gratitude. Clinician and group members discussed regret in relation to behaviors in addiction and utilizing radical acceptance to acknowledge past behaviors and utilizing change in behaviors to address previous behavior patterns.    Summary: Client presented on time. Client did not endorse SI/HI. Client did not appear psychotic or intoxicated at the time of assessment. Client showed progress toward goal of achieving and maintaining sobriety AEB  unchanged sobriety date. Client  showed progress toward building sober support system AEB attending community support meetings. Client provided supportive feedback for group members on addressing changes in thought patterns which were previously helpful for himself. Client co-facilitated information on differences in guilt and shame and was able to identify differences in personal life and ways some had already been addressed. Client plans to support recovery other the weekend by staying busy, spending time with family, and continuing to engage with NA meetings.   Family Program: Family present? No   Name of family member(s): NA  UDS collected: No Results: NA  AA/NA attended?: Yes    Harlon Ditty, LCSW

## 2020-11-09 NOTE — Progress Notes (Signed)
    Daily Group Progress Note  Program: CD-IOP   Group Time: 9am-10am Participation Level: Active Behavioral Response: Appropriate Type of Therapy: Psycho-education Group Topic: Psychoeducational presentation co-facilitated by Berna Spare. from [facility]. STI and HIV psychoeducation was provided to clients. Co-facilitator provided local resources for testing and treatment. Clients were provided with time to ask questions and discuss local resources.     Group Time: 10am-12pm Participation Level: Active Behavioral Response: Appropriate Type of Therapy: Process Group Topic: Process group: Clinician checked in with clients, assessing for SI/HI/psychosis and overall level of functioning including cravings, barriers to sobriety, and highlights when skills were successfully used. Clinician processed with client radical acceptance and control of self, not others behaviors and the effect on mood. Clinician challenged group members to focus on goal of conversations to avoid escalated interactions. Clients discussed "RECOVERY" questions with focus on identifying severe enough consequences to motivate changed behaviors, relationship caring contingent on sobriety. Clients provided support for others' progress and solutions to roadblocks. Clinician inquired about self-care activity to be completed before next group.     Summary: Client presented on time and was receptive to psycho-education. Client noted 0/10 for anxiety and depression with 10 being the worst. Client reports improved sleep after finding appropriate formula and helping newborn and wife feel more comfortable.Client reported challenge with feeling probation is a trap and frustrated he is unable to get a job to support family due to constraints. Client actively listened to conversations and provided supportive feedback to group members. Client showed progress toward goals AEB maintained sobriety, engaged in community support group, and  stable mood despite challenges with no cravings or use.   Family Program: Family present? No   Name of family member(s): NA  UDS collected: No Results: NA  AA/NA attended?: Yes   Harlon Ditty, LCSW

## 2020-11-10 ENCOUNTER — Other Ambulatory Visit: Payer: Self-pay

## 2020-11-10 ENCOUNTER — Ambulatory Visit (INDEPENDENT_AMBULATORY_CARE_PROVIDER_SITE_OTHER): Payer: No Payment, Other | Admitting: Behavioral Health

## 2020-11-10 DIAGNOSIS — F1121 Opioid dependence, in remission: Secondary | ICD-10-CM

## 2020-11-12 ENCOUNTER — Ambulatory Visit (INDEPENDENT_AMBULATORY_CARE_PROVIDER_SITE_OTHER): Payer: No Payment, Other | Admitting: Behavioral Health

## 2020-11-12 ENCOUNTER — Other Ambulatory Visit: Payer: Self-pay

## 2020-11-12 DIAGNOSIS — F1121 Opioid dependence, in remission: Secondary | ICD-10-CM | POA: Diagnosis not present

## 2020-11-15 ENCOUNTER — Other Ambulatory Visit: Payer: Self-pay

## 2020-11-15 ENCOUNTER — Ambulatory Visit (INDEPENDENT_AMBULATORY_CARE_PROVIDER_SITE_OTHER): Payer: No Payment, Other | Admitting: Behavioral Health

## 2020-11-15 DIAGNOSIS — F1121 Opioid dependence, in remission: Secondary | ICD-10-CM | POA: Diagnosis not present

## 2020-11-15 NOTE — Group Note (Signed)
Group Topic: General Coping Skills  Group Date: 11/15/2020 Start Time:  9:00 AM End Time: 12:00 PM Facilitators: Mamie Nick, Counselor  Department: [facility]  Number of Participants: 2  Group Focus: affirmation, anxiety, check in, clarity of thought, communication, concentration, coping skills, depression, family, healthy friendships, relapse prevention, and relaxation Treatment Modality:  Cognitive Behavioral Therapy Interventions utilized were clarification, confrontation, exploration, story telling, and support Purpose: enhance coping skills, explore maladaptive thinking, express feelings, express irrational fears, improve communication skills, increase insight, regain self-worth, reinforce self-care, relapse prevention strategies, and trigger / craving management  Name: Jesus Glass Date of Birth: 04-01-1971  MR: [id number]    Level of Participation: active Quality of Participation: attentive, cooperative, engaged, motivated, and offered feedback Interactions with others: gave feedback Mood/Affect: appropriate Triggers (if applicable): N/A Cognition: coherent/clear Progress: Gaining insight Response: Clt checked in by sharing that he was able to get some "much needed rest". He reported that his infant son as been feeling better. Clt further reported "I had a great weekend. I went to this splash park with the kids and my family". Clt shared that he enjoyed sending time with his family engaging in recreational activities. He shared that in his past active addiction, he wouldn't have been able to do these things. Clt shared that he has been attending his AA meetings regularly now that he has created a structured routine. Plan: follow-up needed  Patients Problems:  Patient Active Problem List   Diagnosis Date Noted   Aspiration pneumonia of right lower lobe due to vomit (HCC) 12/13/2016   Overdose of opiate or related narcotic, accidental or  unintentional, initial encounter (HCC) 12/13/2016   Acute respiratory failure with hypoxia (HCC) 12/13/2016     Family Program: Family present? No   Name of family member(s): N/A  UDS collected: No Results: N/A  AA/NA attended?: Yes  Sponsor?: Yes

## 2020-11-17 ENCOUNTER — Other Ambulatory Visit: Payer: Self-pay

## 2020-11-17 ENCOUNTER — Ambulatory Visit (INDEPENDENT_AMBULATORY_CARE_PROVIDER_SITE_OTHER): Payer: No Payment, Other | Admitting: Behavioral Health

## 2020-11-17 DIAGNOSIS — F1121 Opioid dependence, in remission: Secondary | ICD-10-CM

## 2020-11-17 NOTE — Group Note (Signed)
Group Topic: General Coping Skills  Group Date: 11/17/2020 Start Time:  9:00 AM End Time: 12:00 PM Facilitators: Mamie Nick, Counselor  Department: [facility]  Number of Participants: 4  Group Focus: acceptance, affirmation, anger management, anxiety, check in, co-dependency, communication, coping skills, depression, dual diagnosis, family, feeling awareness/expression, forgiveness, impulsivity, other mindfulness, personal responsibility, relapse prevention, relaxation, and self-awareness Treatment Modality:  Cognitive Behavioral Therapy and Dialectical Behavioral Therapy Interventions utilized were assignment, clarification, confrontation, exploration, group exercise, story telling, and support Purpose: enhance coping skills, explore maladaptive thinking, express feelings, express irrational fears, improve communication skills, increase insight, regain self-worth, reinforce self-care, relapse prevention strategies, and trigger / craving management  Name: CLAYVON PARLETT Date of Birth: 28-Apr-1971  MR: [id number]    Level of Participation: active Quality of Participation: attentive and cooperative Interactions with others: gave feedback Mood/Affect: appropriate Triggers (if applicable): N/A Cognition: coherent/clear Progress: Moderate Response: Clt checked-in by sharing that "everything's going well". He shared that he has been staying focused on his recovery and taking care of his newborn son. Clt reports daily attendance to [employer] and communicating with his sponsor. Clt denied having any recent urges or cravings.  Plan: follow-up needed  Patients Problems:  Patient Active Problem List   Diagnosis Date Noted   Aspiration pneumonia of right lower lobe due to vomit (HCC) 12/13/2016   Overdose of opiate or related narcotic, accidental or unintentional, initial encounter (HCC) 12/13/2016   Acute respiratory failure with hypoxia (HCC) 12/13/2016      Family Program: Family present? No   Name of family member(s): N/A  UDS collected: No Results: N/A  AA/NA attended?: YesMonday, Tuesday, Wednesday, Thursday, and Friday  Sponsor?: Yes

## 2020-11-19 ENCOUNTER — Other Ambulatory Visit: Payer: Self-pay

## 2020-11-19 ENCOUNTER — Ambulatory Visit (INDEPENDENT_AMBULATORY_CARE_PROVIDER_SITE_OTHER): Payer: Self-pay | Admitting: Behavioral Health

## 2020-11-19 DIAGNOSIS — F1121 Opioid dependence, in remission: Secondary | ICD-10-CM

## 2020-11-20 NOTE — Group Note (Signed)
Group Topic: General Coping Skills  Group Date: 11/19/2020 Start Time:  9:00 AM End Time: 12:00 PM Facilitators: Mamie Nick, Counselor  Department: [facility]  Number of Participants: 3  Group Focus: anxiety, check in, clarity of thought, coping skills, family, feeling awareness/expression, and mindful Treatment Modality:  Cognitive Behavioral Therapy and Dialectical Behavioral Therapy Interventions utilized were assignment, clarification, confrontation, exploration, and group exercise Purpose: enhance coping skills, explore maladaptive thinking, express feelings, express irrational fears, improve communication skills, increase insight, regain self-worth, reinforce self-care, relapse prevention strategies, and trigger / craving management  Name: Jesus Glass Date of Birth: 08-04-1970  MR: [id number]    Level of Participation: active Quality of Participation: attentive, cooperative, motivated, and offered feedback Interactions with others: gave feedback Mood/Affect: appropriate Triggers (if applicable): N/A Cognition: coherent/clear and insightful Progress: Moderate Response: Clt checked-in by sharing "I got some great news yesterday. I applied for Medicaid and was told I was eligible for Medicaid benefits." Clt appeared to be very excited about getting this news. He also shared that his infant son's doctor's appointment didn't go so well. Clt shared that him and his fiance are trying to get their baby on the right formula. When clt received the update regarding the transition for [employer], he appeared to be worried as he stated "What do I tell my PO about my group therapy?" Clt was informed by this Clinical research associate that a letter of intent will be prepared for him to share with his PO. Clt engaged well in the Mindfulness group exercise. Plan: follow-up needed  Patients Problems:  Patient Active Problem List   Diagnosis Date Noted   Aspiration pneumonia of right  lower lobe due to vomit (HCC) 12/13/2016   Overdose of opiate or related narcotic, accidental or unintentional, initial encounter (HCC) 12/13/2016   Acute respiratory failure with hypoxia (HCC) 12/13/2016     Family Program: Family present? No   Name of family member(s): N/A  UDS collected: No Results: N/A  AA/NA attended?: YesMonday, Tuesday, Wednesday, and Thursday  Sponsor?: Yes

## 2020-11-25 NOTE — Group Note (Signed)
Group Topic: Feelings and Emotions  Group Date: 11/10/2020 Start Time:  9:00 AM End Time: 12:00 PM Facilitators: Mamie Nick, Counselor  Department: [facility]  Number of Participants: 2  Group Focus: acceptance, affirmation, anxiety, check in, co-dependency, communication, coping skills, dual diagnosis, forgiveness, relapse prevention, and relaxation Treatment Modality:  Cognitive Behavioral Therapy Interventions utilized were clarification, confrontation, exploration, and group exercise Purpose: enhance coping skills, explore maladaptive thinking, express feelings, express irrational fears, improve communication skills, increase insight, regain self-worth, reinforce self-care, relapse prevention strategies, and trigger / craving management  Name: Jesus Glass Date of Birth: 1970/09/21  MR: [id number]    Level of Participation: active Quality of Participation: attentive, cooperative, motivated, and offered feedback Interactions with others: gave feedback Mood/Affect: appropriate Triggers (if applicable): N/A Cognition: coherent/clear Progress: Moderate Response: Clt checked-in by sharing that he is proud of himself for maintaining his compliance with his treatment. Clt reports he has not been able to get much sleep due to his baby being diagnosed with Colic. Clt shared that the physical signs he looks out for when he's experiencing increased anxiety: "I will lose my appetite when I have increased anxiety". Clt talked about his frustrations with the legal system. He shared that his ankle monitor has been send false alerts to his PO. Clt engaged well in group discussion about Life & Balance.    Plan: follow-up needed  Patients Problems:  Patient Active Problem List   Diagnosis Date Noted   Aspiration pneumonia of right lower lobe due to vomit (HCC) 12/13/2016   Overdose of opiate or related narcotic, accidental or unintentional, initial encounter  (HCC) 12/13/2016   Acute respiratory failure with hypoxia (HCC) 12/13/2016     Family Program: Family present? No   Name of family member(s): N/A  UDS collected: No Results:   AA/NA attended?: YesMonday, Tuesday, Wednesday, Thursday, and Friday  Sponsor?: Yes

## 2020-12-02 NOTE — Group Note (Signed)
Group Topic: Core Beliefs  Group Date: 11/12/2020 Start Time:  9:00 AM End Time: 12:00 PM Facilitators: Mamie Nick, Counselor  Department: [facility]  Number of Participants: 4  Group Focus: acceptance, affirmation, anger management, anxiety, check in, communication, coping skills, dual diagnosis, family, healthy friendships, impulsivity, problem solving, and relapse prevention Treatment Modality:  Cognitive Behavioral Therapy Interventions utilized were clarification, confrontation, exploration, patient education, story telling, and support Purpose: enhance coping skills, explore maladaptive thinking, express feelings, express irrational fears, improve communication skills, increase insight, regain self-worth, reinforce self-care, relapse prevention strategies, and trigger / craving management  Name: Jesus Glass Date of Birth: 06/05/1970  MR: [id number]    Level of Participation: active Quality of Participation: attentive, cooperative, motivated, and offered feedback Interactions with others: gave feedback Mood/Affect: appropriate Triggers (if applicable): N/A Cognition: coherent/clear Progress: Significant Response: Clt checked-in by sharing "I haven't slept in 2 days due to my baby having Colic". Clt rated his depression and anxiety at a zero, with zero being none. He denied past/current thoughts of self-harm. Clt engaged well in group discussion regarding core values and how these core values impact his recovery. Clt offered support to other group members through sharing his experiences with probation and incarceration. He also shared that his experiences have shaped his core values. Plan: follow-up needed  Patients Problems:  Patient Active Problem List   Diagnosis Date Noted   Aspiration pneumonia of right lower lobe due to vomit (HCC) 12/13/2016   Overdose of opiate or related narcotic, accidental or unintentional, initial encounter (HCC)  12/13/2016   Acute respiratory failure with hypoxia (HCC) 12/13/2016     Family Program: Family present? No   Name of family member(s): N/A  UDS collected: No Results: N/A  AA/NA attended?: YesMonday, Tuesday, Wednesday, Thursday, and Friday  Sponsor?: Yes

## 2021-06-20 ENCOUNTER — Emergency Department (HOSPITAL_COMMUNITY): Payer: Commercial Managed Care - HMO

## 2021-06-20 ENCOUNTER — Encounter (HOSPITAL_COMMUNITY): Payer: Self-pay | Admitting: *Deleted

## 2021-06-20 ENCOUNTER — Other Ambulatory Visit: Payer: Self-pay

## 2021-06-20 ENCOUNTER — Inpatient Hospital Stay (HOSPITAL_COMMUNITY)
Admission: EM | Admit: 2021-06-20 | Discharge: 2021-06-21 | DRG: 203 | Disposition: A | Discharge status: Home / Self Care | Payer: Commercial Managed Care - HMO | Attending: Family Medicine | Admitting: Family Medicine

## 2021-06-20 ENCOUNTER — Inpatient Hospital Stay (HOSPITAL_COMMUNITY): Payer: Commercial Managed Care - HMO

## 2021-06-20 DIAGNOSIS — F17210 Nicotine dependence, cigarettes, uncomplicated: Secondary | ICD-10-CM | POA: Diagnosis present

## 2021-06-20 DIAGNOSIS — Z720 Tobacco use: Secondary | ICD-10-CM | POA: Diagnosis present

## 2021-06-20 DIAGNOSIS — R6510 Systemic inflammatory response syndrome (SIRS) of non-infectious origin without acute organ dysfunction: Secondary | ICD-10-CM | POA: Diagnosis present

## 2021-06-20 DIAGNOSIS — R079 Chest pain, unspecified: Secondary | ICD-10-CM | POA: Diagnosis present

## 2021-06-20 DIAGNOSIS — F1120 Opioid dependence, uncomplicated: Secondary | ICD-10-CM | POA: Diagnosis present

## 2021-06-20 DIAGNOSIS — J40 Bronchitis, not specified as acute or chronic: Secondary | ICD-10-CM | POA: Diagnosis present

## 2021-06-20 DIAGNOSIS — Z888 Allergy status to other drugs, medicaments and biological substances status: Secondary | ICD-10-CM

## 2021-06-20 DIAGNOSIS — J189 Pneumonia, unspecified organism: Secondary | ICD-10-CM | POA: Diagnosis not present

## 2021-06-20 DIAGNOSIS — D72829 Elevated white blood cell count, unspecified: Secondary | ICD-10-CM | POA: Diagnosis present

## 2021-06-20 DIAGNOSIS — F1910 Other psychoactive substance abuse, uncomplicated: Secondary | ICD-10-CM | POA: Diagnosis not present

## 2021-06-20 DIAGNOSIS — M549 Dorsalgia, unspecified: Secondary | ICD-10-CM | POA: Diagnosis present

## 2021-06-20 DIAGNOSIS — J9601 Acute respiratory failure with hypoxia: Secondary | ICD-10-CM | POA: Diagnosis not present

## 2021-06-20 DIAGNOSIS — Z885 Allergy status to narcotic agent status: Secondary | ICD-10-CM | POA: Diagnosis not present

## 2021-06-20 DIAGNOSIS — G8929 Other chronic pain: Secondary | ICD-10-CM | POA: Diagnosis present

## 2021-06-20 DIAGNOSIS — Z20822 Contact with and (suspected) exposure to covid-19: Secondary | ICD-10-CM | POA: Diagnosis present

## 2021-06-20 DIAGNOSIS — K219 Gastro-esophageal reflux disease without esophagitis: Secondary | ICD-10-CM | POA: Diagnosis present

## 2021-06-20 DIAGNOSIS — Z79899 Other long term (current) drug therapy: Secondary | ICD-10-CM | POA: Diagnosis not present

## 2021-06-20 LAB — RAPID URINE DRUG SCREEN, HOSP PERFORMED
Amphetamines: NOT DETECTED
Barbiturates: NOT DETECTED
Benzodiazepines: NOT DETECTED
Cocaine: NOT DETECTED
Opiates: NOT DETECTED
Tetrahydrocannabinol: NOT DETECTED

## 2021-06-20 LAB — ECHOCARDIOGRAM COMPLETE
AR max vel: 3.07 cm2
AV Area VTI: 2.82 cm2
AV Area mean vel: 2.99 cm2
AV Mean grad: 3 mmHg
AV Peak grad: 6.7 mmHg
Ao pk vel: 1.29 m/s
Area-P 1/2: 3.27 cm2
Height: 71 in
S' Lateral: 3.1 cm
Weight: 3573.22 oz

## 2021-06-20 LAB — D-DIMER, QUANTITATIVE: D-Dimer, Quant: 0.52 ug/mL-FEU — ABNORMAL HIGH (ref 0.00–0.50)

## 2021-06-20 LAB — CBC
HCT: 44.2 % (ref 39.0–52.0)
Hemoglobin: 14.4 g/dL (ref 13.0–17.0)
MCH: 28.2 pg (ref 26.0–34.0)
MCHC: 32.6 g/dL (ref 30.0–36.0)
MCV: 86.5 fL (ref 80.0–100.0)
Platelets: 166 10*3/uL (ref 150–400)
RBC: 5.11 MIL/uL (ref 4.22–5.81)
RDW: 14.4 % (ref 11.5–15.5)
WBC: 12.2 10*3/uL — ABNORMAL HIGH (ref 4.0–10.5)
nRBC: 0 % (ref 0.0–0.2)

## 2021-06-20 LAB — BASIC METABOLIC PANEL
Anion gap: 11 (ref 5–15)
BUN: 14 mg/dL (ref 6–20)
CO2: 24 mmol/L (ref 22–32)
Calcium: 9.2 mg/dL (ref 8.9–10.3)
Chloride: 103 mmol/L (ref 98–111)
Creatinine, Ser: 1.05 mg/dL (ref 0.61–1.24)
GFR, Estimated: >60 mL/min (ref >60)
Glucose, Bld: 92 mg/dL (ref 70–99)
Potassium: 4 mmol/L (ref 3.5–5.1)
Sodium: 138 mmol/L (ref 135–145)

## 2021-06-20 LAB — PROCALCITONIN: Procalcitonin: <0.1 ng/mL

## 2021-06-20 LAB — LIPID PANEL
Cholesterol: 172 mg/dL (ref 0–200)
HDL: 39 mg/dL — ABNORMAL LOW (ref >40)
LDL Cholesterol: 111 mg/dL — ABNORMAL HIGH (ref 0–99)
Total CHOL/HDL Ratio: 4.4 RATIO
Triglycerides: 110 mg/dL (ref <150)
VLDL: 22 mg/dL (ref 0–40)

## 2021-06-20 LAB — RESP PANEL BY RT-PCR (FLU A&B, COVID) ARPGX2
Influenza A by PCR: NEGATIVE
Influenza B by PCR: NEGATIVE
SARS Coronavirus 2 by RT PCR: NEGATIVE

## 2021-06-20 LAB — HIV ANTIBODY (ROUTINE TESTING W REFLEX): HIV Screen 4th Generation wRfx: NONREACTIVE

## 2021-06-20 LAB — LACTIC ACID, PLASMA
Lactic Acid, Venous: 0.8 mmol/L (ref 0.5–1.9)
Lactic Acid, Venous: 1 mmol/L (ref 0.5–1.9)
Lactic Acid, Venous: 1.3 mmol/L (ref 0.5–1.9)

## 2021-06-20 LAB — TROPONIN I (HIGH SENSITIVITY)
Troponin I (High Sensitivity): 5 ng/L (ref <18)
Troponin I (High Sensitivity): 5 ng/L (ref <18)

## 2021-06-20 LAB — PROTIME-INR
INR: 1 (ref 0.8–1.2)
Prothrombin Time: 12.8 seconds (ref 11.4–15.2)

## 2021-06-20 MED ORDER — ONDANSETRON HCL 4 MG/2ML IJ SOLN: 4.0000 mg | Freq: Four times a day (QID) | INTRAMUSCULAR | Status: DC | PRN

## 2021-06-20 MED ORDER — ENOXAPARIN SODIUM 60 MG/0.6ML IJ SOSY
50.0000 mg | PREFILLED_SYRINGE | INTRAMUSCULAR | Status: DC
  Filled 2021-06-20: qty 0.6

## 2021-06-20 MED ORDER — SODIUM CHLORIDE 0.9% FLUSH
3.0000 mL | Freq: Two times a day (BID) | INTRAVENOUS | Status: DC
  Administered 2021-06-20 – 2021-06-21 (×2): 3 mL via INTRAVENOUS

## 2021-06-20 MED ORDER — SODIUM CHLORIDE 0.9 % IV SOLN
2.0000 g | INTRAVENOUS | Status: DC
  Administered 2021-06-20: 2 g via INTRAVENOUS
  Filled 2021-06-20 (×2): qty 20

## 2021-06-20 MED ORDER — ALBUTEROL SULFATE (2.5 MG/3ML) 0.083% IN NEBU
2.5000 mg | INHALATION_SOLUTION | Freq: Once | RESPIRATORY_TRACT | Status: AC
  Administered 2021-06-20: 2.5 mg via RESPIRATORY_TRACT
  Filled 2021-06-20: qty 3

## 2021-06-20 MED ORDER — ALBUTEROL SULFATE (2.5 MG/3ML) 0.083% IN NEBU: 2.5000 mg | INHALATION_SOLUTION | Freq: Four times a day (QID) | RESPIRATORY_TRACT | Status: DC | PRN

## 2021-06-20 MED ORDER — ACETAMINOPHEN 650 MG RE SUPP: 650.0000 mg | Freq: Four times a day (QID) | RECTAL | Status: DC | PRN

## 2021-06-20 MED ORDER — ONDANSETRON HCL 4 MG PO TABS: 4.0000 mg | ORAL_TABLET | Freq: Four times a day (QID) | ORAL | Status: DC | PRN

## 2021-06-20 MED ORDER — METHADONE HCL 10 MG PO TABS
180.0000 mg | ORAL_TABLET | Freq: Every day | ORAL | Status: DC
  Administered 2021-06-20 – 2021-06-21 (×2): 180 mg via ORAL
  Filled 2021-06-20 (×2): qty 18

## 2021-06-20 MED ORDER — SODIUM CHLORIDE 0.9 % IV SOLN
500.0000 mg | INTRAVENOUS | Status: DC
  Administered 2021-06-20: 500 mg via INTRAVENOUS
  Filled 2021-06-20 (×2): qty 5

## 2021-06-20 MED ORDER — ENOXAPARIN SODIUM 40 MG/0.4ML IJ SOSY
40.0000 mg | PREFILLED_SYRINGE | INTRAMUSCULAR | Status: DC
  Administered 2021-06-20: 40 mg via SUBCUTANEOUS
  Filled 2021-06-20: qty 0.4

## 2021-06-20 MED ORDER — PANTOPRAZOLE SODIUM 40 MG IV SOLR
40.0000 mg | Freq: Once | INTRAVENOUS | Status: AC
  Administered 2021-06-20: 40 mg via INTRAVENOUS
  Filled 2021-06-20: qty 40

## 2021-06-20 MED ORDER — PANTOPRAZOLE SODIUM 40 MG PO TBEC
40.0000 mg | DELAYED_RELEASE_TABLET | Freq: Every day | ORAL | Status: DC
  Administered 2021-06-21: 40 mg via ORAL
  Filled 2021-06-20: qty 1

## 2021-06-20 MED ORDER — LACTATED RINGERS IV SOLN: INTRAVENOUS | Status: DC

## 2021-06-20 MED ORDER — GUAIFENESIN ER 600 MG PO TB12
600.0000 mg | ORAL_TABLET | Freq: Two times a day (BID) | ORAL | Status: DC
  Administered 2021-06-20 – 2021-06-21 (×3): 600 mg via ORAL
  Filled 2021-06-20 (×3): qty 1

## 2021-06-20 MED ORDER — ACETAMINOPHEN 325 MG PO TABS: 650.0000 mg | ORAL_TABLET | Freq: Four times a day (QID) | ORAL | Status: DC | PRN

## 2021-06-20 MED ORDER — LACTATED RINGERS IV BOLUS (SEPSIS)
1000.0000 mL | Freq: Once | INTRAVENOUS | Status: AC
  Administered 2021-06-20: 1000 mL via INTRAVENOUS

## 2021-06-20 MED ORDER — IOHEXOL 350 MG/ML SOLN
58.0000 mL | Freq: Once | INTRAVENOUS | Status: AC | PRN
  Administered 2021-06-20: 58 mL via INTRAVENOUS

## 2021-06-20 MED ORDER — NICOTINE 21 MG/24HR TD PT24
21.0000 mg | MEDICATED_PATCH | Freq: Every day | TRANSDERMAL | Status: DC
  Administered 2021-06-20 – 2021-06-21 (×2): 21 mg via TRANSDERMAL
  Filled 2021-06-20 (×2): qty 1

## 2021-06-20 NOTE — Sepsis Progress Note (Signed)
Sepsis protocol is being monitored by eLink. 

## 2021-06-20 NOTE — Sepsis Progress Note (Signed)
Notified provider of need to order repeat lactic acid since the 2nd LA was higher than the first.  °

## 2021-06-20 NOTE — TOC Progression Note (Signed)
Transition of Care [facility]) - Progression Note    Patient Details  Name: MAZEN MARCIN MRN: [id number] Date of Birth: [birth year]  Transition of Care [facility]) CM/SW Contact  Leone Haven, RN Phone Number: 06/20/2021, 1:12 PM  Clinical Narrative:     Transition of Care [facility]) Screening Note   Patient Details  Name: JADA KUHNERT Date of Birth: [birth year]   Transition of Care [facility]) CM/SW Contact:    Leone Haven, RN Phone Number: 06/20/2021, 1:12 PM    Transition of Care Department [facility]) has reviewed patient and no TOC needs have been identified at this time. We will continue to monitor patient advancement through interdisciplinary progression rounds. If new patient transition needs arise, please place a TOC consult.          Expected Discharge Plan and Services                                                 Social Determinants of Health (SDOH) Interventions    Readmission Risk Interventions No flowsheet data found.

## 2021-06-20 NOTE — ED Provider Notes (Signed)
[facility] 3E HF PCU Provider Note   CSN: [id number] Arrival date & time: 06/20/21  4497     History  Chief Complaint  Patient presents with   Chest Pain    MARCELLIS AMADO is a 51 y.o. male.  HPI     Pt presents with SOB and CP that began yesterday evening. States the pain is worse with deep breathing and is described as sharp. The pain is located in the center of his chest and it does not radiate. He has also had a cough for about 6 months, reports some green phlegm. Denies fever or chills but endorses sweating. Had 1 episode of vomiting this morning after a coughing fit, he relates this to his pain; he is not currently nauseous. Denies other URI symptoms, diarrhea/constipation. Denies leg swelling, redness, or pain. He does have a 30+ year smoking history, denies alcohol use or current illicit drug use. Pt has a hx of opioid use but reports 1 year of sobriety.   Home Medications Prior to Admission medications   Medication Sig Start Date End Date Taking? Authorizing Provider  methadone (DOLOPHINE) 1 MG/1ML solution Take 180 mg by mouth daily.   Yes [provider]      Allergies    Hydrocodone and Tramadol    Review of Systems   Review of Systems  Physical Exam Updated Vital Signs BP 135/84 (BP Location: Right Arm)    Pulse 71    Temp 97.6 F (36.4 C) (Oral)    Resp 18    Ht 5\' 11"  (1.803 m)    Wt 100.7 kg Comment: scale c   SpO2 95%    BMI 30.96 kg/m  Physical Exam Vitals and nursing note reviewed.  Constitutional:      Appearance: He is well-developed.  HENT:     Head: Atraumatic.  Cardiovascular:     Rate and Rhythm: Normal rate.  Pulmonary:     Effort: Pulmonary effort is normal.     Breath sounds: Examination of the right-middle field reveals rhonchi and rales. Examination of the right-lower field reveals rhonchi and rales. Rhonchi and rales present.  Musculoskeletal:     Cervical back: Neck supple.     Right lower leg: No tenderness.  No edema.     Left lower leg: No tenderness. No edema.  Skin:    General: Skin is warm.  Neurological:     Mental Status: He is alert and oriented to person, place, and time.    ED Results / Procedures / Treatments   Labs (all labs ordered are listed, but only abnormal results are displayed) Labs Reviewed  CBC - Abnormal; Notable for the following components:      Result Value   WBC 12.2 (*)    All other components within normal limits  D-DIMER, QUANTITATIVE - Abnormal; Notable for the following components:   D-Dimer, Quant 0.52 (*)    All other components within normal limits  LIPID PANEL - Abnormal; Notable for the following components:   HDL 39 (*)    LDL Cholesterol 111 (*)    All other components within normal limits  CBC - Abnormal; Notable for the following components:   Hemoglobin 12.9 (*)    HCT 38.9 (*)    Platelets 135 (*)    All other components within normal limits  BASIC METABOLIC PANEL - Abnormal; Notable for the following components:   Calcium 8.7 (*)    All other components within normal limits  RESP PANEL BY RT-PCR (FLU A&B, COVID) ARPGX2  CULTURE, BLOOD (ROUTINE X 2)  CULTURE, BLOOD (ROUTINE X 2)  BASIC METABOLIC PANEL  PROTIME-INR  LACTIC ACID, PLASMA  LACTIC ACID, PLASMA  RAPID URINE DRUG SCREEN, HOSP PERFORMED  PROCALCITONIN  HIV ANTIBODY (ROUTINE TESTING W REFLEX)  LACTIC ACID, PLASMA  URINALYSIS, ROUTINE W REFLEX MICROSCOPIC  TROPONIN I (HIGH SENSITIVITY)  TROPONIN I (HIGH SENSITIVITY)    EKG EKG Interpretation  Date/Time:  Monday June 20 2021 06:29:54 EST Ventricular Rate:  102 PR Interval:  130 QRS Duration: 92 QT Interval:  337 QTC Calculation: 439 R Axis:   39 Text Interpretation: Sinus tachycardia No acute changes No significant change since last tracing Confirmed by Derwood Kaplan [phone]) on 06/20/2021 7:48:00 AM  Radiology CT Angio Chest Pulmonary Embolism (PE) W or WO Contrast  Result Date: 06/20/2021 CLINICAL DATA:   Pulmonary embolism (PE) suspected, positive D-dimer, chest pain, fever EXAM: CT ANGIOGRAPHY CHEST WITH CONTRAST TECHNIQUE: Multidetector CT imaging of the chest was performed using the standard protocol during bolus administration of intravenous contrast. Multiplanar CT image reconstructions and MIPs were obtained to evaluate the vascular anatomy. RADIATION DOSE REDUCTION: This exam was performed according to the departmental dose-optimization program which includes automated exposure control, adjustment of the mA and/or kV according to patient size and/or use of iterative reconstruction technique. CONTRAST:  35mL OMNIPAQUE IOHEXOL 350 MG/ML SOLN COMPARISON:  06/20/2021 FINDINGS: Cardiovascular: This is a technically adequate evaluation of the pulmonary vasculature. No filling defects or pulmonary emboli. The heart is unremarkable without pericardial effusion. No evidence of thoracic aortic aneurysm or dissection. Mediastinum/Nodes: No enlarged mediastinal, hilar, or axillary lymph nodes. Thyroid gland, trachea, and esophagus demonstrate no significant findings. Small hiatal hernia. Lungs/Pleura: Dependent linear areas of consolidation within the lower lobes are likely related to hypoventilatory change and subsegmental atelectasis. Mosaic attenuation pattern with scattered ground-glass opacities in the upper lobe could reflect areas of air trapping or small airway disease. There is mild upper lobe predominant bronchial wall thickening. No effusion or pneumothorax. The central airways are patent. Upper Abdomen: Probable splenomegaly, measuring 16 cm in anterior-posterior dimension. Spleen is incompletely evaluated on this study due to slice selection. No other acute upper abdominal findings. Musculoskeletal: No acute or destructive bony lesions. Reconstructed images demonstrate no additional findings. Review of the MIP images confirms the above findings. IMPRESSION: 1. No evidence of pulmonary embolus. 2. Upper lobe  predominant bronchial wall thickening, with scattered ground-glass opacities, most consistent with bronchitis. 3. Bilateral lower lobe subsegmental atelectasis. 4. Probable splenomegaly, incompletely evaluated on this study. 5. Small hiatal hernia. Electronically Signed   By: Sharlet Salina M.D.   On: 06/20/2021 18:35   DG Chest Portable 1 View  Result Date: 06/20/2021 CLINICAL DATA:  Chest pain and fever. EXAM: PORTABLE CHEST 1 VIEW COMPARISON:  PA Lat 12/13/2016. FINDINGS: The heart size and mediastinal contours are within normal limits. Both lungs are clear. The visualized skeletal structures are unremarkable. Aortic atherosclerosis. Osteopenia. IMPRESSION: No active disease. Electronically Signed   By: Almira Bar M.D.   On: 06/20/2021 06:59   ECHOCARDIOGRAM COMPLETE  Result Date: 06/20/2021    ECHOCARDIOGRAM REPORT   Patient Name:   FENIX WILCKEN Date of Exam: 06/20/2021 Medical Rec #:  [id number]          Height:       71.0 in Accession #:    [id number]         Weight:       223.3 lb  Date of Birth:  1970-10-14         BSA:          2.210 m Patient Age:    60 years           BP:           125/88 mmHg Patient Gender: M                  HR:           79 bpm. Exam Location:  Inpatient Procedure: 2D Echo Indications:    Chest pain  History:        Patient has no prior history of Echocardiogram examinations.  Sonographer:    Arlyss Gandy Referring Phys: [id number] RONDELL A SMITH IMPRESSIONS  1. Left ventricular ejection fraction, by estimation, is 60 to 65%. The left ventricle has normal function. The left ventricle has no regional wall motion abnormalities. Left ventricular diastolic parameters were normal.  2. Right ventricular systolic function is normal. The right ventricular size is normal.  3. The mitral valve is normal in structure. No evidence of mitral valve regurgitation. No evidence of mitral stenosis.  4. The aortic valve is normal in structure. Aortic valve regurgitation is not visualized.  No aortic stenosis is present.  5. There is borderline dilatation of the aortic root, measuring 37 mm.  6. The inferior vena cava is normal in size with greater than 50% respiratory variability, suggesting right atrial pressure of 3 mmHg. Comparison(s): No prior Echocardiogram. FINDINGS  Left Ventricle: Left ventricular ejection fraction, by estimation, is 60 to 65%. The left ventricle has normal function. The left ventricle has no regional wall motion abnormalities. The left ventricular internal cavity size was normal in size. There is  no left ventricular hypertrophy. Left ventricular diastolic parameters were normal. Right Ventricle: The right ventricular size is normal. No increase in right ventricular wall thickness. Right ventricular systolic function is normal. Left Atrium: Left atrial size was normal in size. Right Atrium: Right atrial size was normal in size. Pericardium: There is no evidence of pericardial effusion. Mitral Valve: The mitral valve is normal in structure. No evidence of mitral valve regurgitation. No evidence of mitral valve stenosis. Tricuspid Valve: The tricuspid valve is normal in structure. Tricuspid valve regurgitation is not demonstrated. No evidence of tricuspid stenosis. Aortic Valve: The aortic valve is normal in structure. Aortic valve regurgitation is not visualized. No aortic stenosis is present. Aortic valve mean gradient measures 3.0 mmHg. Aortic valve peak gradient measures 6.7 mmHg. Aortic valve area, by VTI measures 2.82 cm. Pulmonic Valve: The pulmonic valve was normal in structure. Pulmonic valve regurgitation is not visualized. No evidence of pulmonic stenosis. Aorta: There is borderline dilatation of the aortic root, measuring 37 mm. Venous: The inferior vena cava is normal in size with greater than 50% respiratory variability, suggesting right atrial pressure of 3 mmHg. IAS/Shunts: No atrial level shunt detected by color flow Doppler.  LEFT VENTRICLE PLAX 2D LVIDd:          4.90 cm   Diastology LVIDs:         3.10 cm   LV e' medial:    8.81 cm/s LV PW:         0.90 cm   LV E/e' medial:  8.6 LV IVS:        0.90 cm   LV e' lateral:   10.60 cm/s LVOT diam:     2.00 cm   LV E/e' lateral: 7.1  LV SV:         78 LV SV Index:   35 LVOT Area:     3.14 cm  RIGHT VENTRICLE             IVC RV Basal diam:  3.60 cm     IVC diam: 1.80 cm RV Mid diam:    3.20 cm RV S prime:     14.80 cm/s TAPSE (M-mode): 3.0 cm LEFT ATRIUM             Index        RIGHT ATRIUM           Index LA diam:        3.50 cm 1.58 cm/m   RA Area:     17.60 cm LA Vol (A2C):   49.6 ml 22.45 ml/m  RA Volume:   45.40 ml  20.55 ml/m LA Vol (A4C):   32.3 ml 14.62 ml/m LA Biplane Vol: 43.2 ml 19.55 ml/m  AORTIC VALVE AV Area (Vmax):    3.07 cm AV Area (Vmean):   2.99 cm AV Area (VTI):     2.82 cm AV Vmax:           129.00 cm/s AV Vmean:          85.700 cm/s AV VTI:            0.277 m AV Peak Grad:      6.7 mmHg AV Mean Grad:      3.0 mmHg LVOT Vmax:         126.00 cm/s LVOT Vmean:        81.500 cm/s LVOT VTI:          0.249 m LVOT/AV VTI ratio: 0.90  AORTA Ao Root diam: 3.70 cm Ao Asc diam:  3.40 cm MITRAL VALVE MV Area (PHT): 3.27 cm    SHUNTS MV Decel Time: 232 msec    Systemic VTI:  0.25 m MV E velocity: 75.60 cm/s  Systemic Diam: 2.00 cm MV A velocity: 61.00 cm/s MV E/A ratio:  1.24 Kardie Tobb DO Electronically signed by Berniece Salines DO Signature Date/Time: 06/20/2021/4:12:10 PM    Final     Procedures .Critical Care Performed by: Varney Biles, MD Authorized by: Varney Biles, MD   Critical care provider statement:    Critical care time (minutes):  40   Critical care was necessary to treat or prevent imminent or life-threatening deterioration of the following conditions:  Respiratory failure   Critical care was time spent personally by me on the following activities:  Development of treatment plan with patient or surrogate, discussions with consultants, evaluation of patient's response to treatment,  examination of patient, ordering and review of laboratory studies, ordering and review of radiographic studies, ordering and performing treatments and interventions, pulse oximetry, re-evaluation of patient's condition and review of old charts    Medications Ordered in ED Medications  sodium chloride flush (NS) 0.9 % injection 3 mL (3 mLs Intravenous Given 06/20/21 2224)  acetaminophen (TYLENOL) tablet 650 mg (has no administration in time range)    Or  acetaminophen (TYLENOL) suppository 650 mg (has no administration in time range)  albuterol (PROVENTIL) (2.5 MG/3ML) 0.083% nebulizer solution 2.5 mg (has no administration in time range)  ondansetron (ZOFRAN) tablet 4 mg (has no administration in time range)    Or  ondansetron (ZOFRAN) injection 4 mg (has no administration in time range)  guaiFENesin (MUCINEX) 12 hr tablet 600 mg (600 mg Oral Given 06/20/21 2219)  methadone (DOLOPHINE) tablet 180 mg (180 mg Oral Given 06/20/21 1040)  nicotine (NICODERM CQ - dosed in mg/24 hours) patch 21 mg (21 mg Transdermal Patch Applied 06/20/21 1439)  pantoprazole (PROTONIX) EC tablet 40 mg (has no administration in time range)  enoxaparin (LOVENOX) injection 50 mg (has no administration in time range)  azithromycin (ZITHROMAX) tablet 250 mg (has no administration in time range)  lactated ringers bolus 1,000 mL (0 mLs Intravenous Stopped 06/20/21 1439)  pantoprazole (PROTONIX) injection 40 mg (40 mg Intravenous Given 06/20/21 1439)  albuterol (PROVENTIL) (2.5 MG/3ML) 0.083% nebulizer solution 2.5 mg (2.5 mg Nebulization Given 06/20/21 1413)  iohexol (OMNIPAQUE) 350 MG/ML injection 58 mL (58 mLs Intravenous Contrast Given 06/20/21 1828)    ED Course/ Medical Decision Making/ A&P                           Medical Decision Making Problems Addressed: Acute respiratory failure with hypoxia [facility]): acute illness or injury that poses a threat to life or bodily functions Community acquired pneumonia of right lower  lobe of lung: acute illness or injury with systemic symptoms  Amount and/or Complexity of Data Reviewed Labs: ordered. Decision-making details documented in ED Course. Radiology: ordered and independent interpretation performed. Decision-making details documented in ED Course. ECG/medicine tests: ordered and independent interpretation performed.  Risk Decision regarding hospitalization.   51 year old male comes in with chief complaint of chest pain.  Chest pain is pleuritic and patient has cough producing green phlegm.   He has history of substance use disorder, currently on methadone.  Patient is noted to have hypoxia.  He is requiring 2 L of oxygen to maintain O2 sats over 90%.  On exam, right-sided rales/rhonchi and mild wheezing.  Clinical concerns for PNA. Differential diagnoses also included CHF, PE, COVID-19, pleural effusion, pulmonary edema.  X-rays ordered and independently reviewed.  It does not show any pleural effusion, pulmonary edema or direct consolidation consistent with pneumonia.  White count is also reassuring.  Patient however continues to have hypoxia and the right-sided abnormal lung findings.  He does not have any risk factors for PE, DVT.  Considered ordering CT scan of the chest for PE work-up, however in the setting of clinical symptoms consistent with infection, clinically patient has pneumonia at this time.  Plan is to admit him to the hospital and if he does not improve, then they can consider CT scan to further evaluate his lung disease.  We will start him on antibiotics.   Final Clinical Impression(s) / ED Diagnoses Final diagnoses:  Community acquired pneumonia of right lower lobe of lung  Acute respiratory failure with hypoxia ([place])    Rx / DC Orders ED Discharge Orders     None         Varney Biles, MD 06/21/21 0900

## 2021-06-20 NOTE — ED Triage Notes (Signed)
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

## 2021-06-20 NOTE — Progress Notes (Signed)
Echocardiogram 2D Echocardiogram has been performed.  Jesus Glass 06/20/2021, 3:57 PM

## 2021-06-20 NOTE — H&P (Addendum)
History and Physical    Jesus Glass [id number] DOB: 1970/07/22 DOA: 06/20/2021  Referring MD/NP/PA: Varney Biles, MD PCP: Collene Leyden, MD  Patient coming from: Home via EMS  Chief Complaint: Chest pain and shortness of breath  I have personally briefly reviewed patient's old medical records in [place]   HPI: Jesus Glass is a 51 y.o. male with medical history significant of tobacco abuse , prior history of IV drug abuse, GERD presents with complaints of chest pain and shortness of breath starting this morning at 3:30 AM.  Patient reports being awoken out of his sleep with centralized chest pressure and burning pain.  Reports being diaphoretic with nausea and 1 episode of vomiting.  Emesis was noted to be nonbloody in appearance.  He also states that the chest pain was pleuritic in nature and as it was difficult for him to take a deep inspiratory breath.  When he woke up he states that his nose was full of fluid and he was coughing.  Jesus Glass also notes that he had been concerned that he possibly had COVID or pneumonia as he has been coughing up green stuff intermittently for the last month.  Denies any history of blood clots, leg swelling, or prolonged immobilization.   ED Course: On admission into the emergency department patient was noted to have a temperature of 99.5 F, pulse 95-102, respirations 18-22, and all other vital signs maintained.  Labs significant for WBC 12.2, lactic acid 1, and high-sensitivity troponin 5.  Chest x-ray showed no acute abnormality.  Influenza and COVID-19 screening were negative.  Blood cultures have been obtained.  Patient had been given 1 L of lactated Ringer's, Rocephin, and azithromycin.  Review of Systems  Constitutional:  Positive for diaphoresis and fever.  HENT:  Positive for congestion.   Respiratory:  Positive for cough, sputum production and shortness of breath.   Cardiovascular:  Positive for chest pain. Negative for  leg swelling.  Gastrointestinal:  Positive for heartburn, nausea and vomiting.  Neurological:  Negative for focal weakness and loss of consciousness.  Psychiatric/Behavioral:  Negative for substance abuse.    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 He has been smoking an average of .5 packs per day. He has never used smokeless tobacco. He reports current drug use. Drug: Methamphetamines. He reports that he does not drink alcohol.  Allergies  Allergen Reactions   Hydrocodone Hives   Tramadol Hives    No family history on file.  Prior to Admission medications   Medication Sig Start Date End Date Taking? Authorizing Provider  ibuprofen (ADVIL,MOTRIN) 200 MG tablet Take 200 mg by mouth every 6 (six) hours as needed for headache, mild pain or moderate pain.    [provider]  oxyCODONE-acetaminophen (PERCOCET) 5-325 MG tablet Take 1 tablet by mouth every 6 (six) hours as needed. Patient taking differently: Take 1 tablet by mouth every 6 (six) hours as needed for severe pain.  06/03/16   Drenda Freeze, MD    Physical Exam:  Constitutional: Middle-age male who appears to be in some distress Vitals:   06/20/21 0640 06/20/21 0700 06/20/21 0715 06/20/21 0730  BP:  (!) 143/99 (!) 148/92 132/90  Pulse:  96 95 96  Resp:  20 18 20   Temp:      SpO2:  94% 94% 94%  Weight: 102.5 kg     Height: 5\' 11"  (1.803 m)      Eyes: PERRL, lids and conjunctivae normal ENMT: Mucous membranes are moist. Posterior pharynx clear of any exudate or lesions.  Neck: normal, supple, no masses, no thyromegaly Respiratory: Decreased overall aeration with left-sided rales appreciated.  No significant wheezing noted at this time.  O2 saturation currently maintained on 2 L of nasal cannula oxygen. Cardiovascular: Regular  rate and rhythm, no murmurs / rubs / gallops. No extremity edema.   Abdomen: no tenderness, no masses palpated. No hepatosplenomegaly. Bowel sounds positive.  Musculoskeletal: no clubbing / cyanosis. No joint deformity upper and lower extremities. Good ROM, no contractures. Normal muscle tone.  Skin: no rashes, lesions, ulcers. No induration Neurologic: CN 2-12 grossly intact.  Strength 5/5 in all 4.  Psychiatric: Normal judgment and insight. Alert and oriented x 3. Normal mood.     Labs on Admission: I have personally reviewed following labs and imaging studies  CBC: Recent Labs  Lab 06/20/21 0635  WBC 12.2*  HGB 14.4  HCT 44.2  MCV 86.5  PLT [id number]   Basic Metabolic Panel: Recent Labs  Lab 06/20/21 0635  NA 138  K 4.0  CL 103  CO2 24  GLUCOSE 92  BUN 14  CREATININE 1.05  CALCIUM 9.2   GFR: Estimated Creatinine Clearance: 100.3 mL/min (by C-G formula based on SCr of 1.05 mg/dL). Liver Function Tests: No results for input(s): AST, ALT, ALKPHOS, BILITOT, PROT, ALBUMIN in the last 168 hours. No results for input(s): LIPASE, AMYLASE in the last 168 hours. No results for input(s): AMMONIA in the last 168 hours. Coagulation Profile: Recent Labs  Lab 06/20/21 0635  INR 1.0   Cardiac Enzymes: No results for input(s): CKTOTAL, CKMB, CKMBINDEX, TROPONINI in the last 168 hours. BNP (last 3 results) No results for input(s): PROBNP in the last 8760 hours. HbA1C: No results for input(s): HGBA1C in the last 72 hours. CBG: No results for input(s): GLUCAP in the last 168 hours. Lipid Profile: No results for input(s): CHOL, HDL, LDLCALC, TRIG, CHOLHDL, LDLDIRECT in the last 72 hours. Thyroid Function Tests: No results for input(s): TSH, T4TOTAL, FREET4, T3FREE, THYROIDAB in the last 72 hours. Anemia Panel: No results for input(s): VITAMINB12, FOLATE, FERRITIN, TIBC, IRON, RETICCTPCT in the last 72 hours. Urine analysis:    Component Value Date/Time   COLORURINE YELLOW  12/13/2016 1909   APPEARANCEUR HAZY (A) 12/13/2016 1909   LABSPEC 1.016 12/13/2016 1909   PHURINE 5.0 12/13/2016 1909   GLUCOSEU NEGATIVE 12/13/2016 1909   HGBUR NEGATIVE 12/13/2016 1909   BILIRUBINUR NEGATIVE 12/13/2016 [place] NEGATIVE 12/13/2016 1909   PROTEINUR NEGATIVE 12/13/2016 1909   NITRITE NEGATIVE 12/13/2016 1909   LEUKOCYTESUR NEGATIVE 12/13/2016 1909   Sepsis Labs: Recent Results (from the past 240 hour(s))  Resp Panel by RT-PCR (Flu A&B, Covid) Nasopharyngeal Swab     Status: None   Collection Time: 06/20/21  6:51 AM   Specimen: Nasopharyngeal Swab; Nasopharyngeal(NP) swabs in vial transport medium  Result Value Ref Range Status   SARS Coronavirus 2 by RT PCR NEGATIVE NEGATIVE Final    Comment: (NOTE) SARS-CoV-2 target nucleic acids are NOT DETECTED.  The SARS-CoV-2 RNA is generally detectable in upper respiratory specimens during the acute phase of infection. The lowest concentration of SARS-CoV-2 viral copies this assay can detect is 138 copies/mL. A negative result does not  preclude SARS-Cov-2 infection and should not be used as the sole basis for treatment or other patient management decisions. A negative result may occur with  improper specimen collection/handling, submission of specimen other than nasopharyngeal swab, presence of viral mutation(s) within the areas targeted by this assay, and inadequate number of viral copies(<138 copies/mL). A negative result must be combined with clinical observations, patient history, and epidemiological information. The expected result is Negative.  Fact Sheet for Patients:  EntrepreneurPulse.com.au  Fact Sheet for Healthcare Providers:  IncredibleEmployment.be  This test is no t yet approved or cleared by the [place] FDA and  has been authorized for detection and/or diagnosis of SARS-CoV-2 by FDA under an Emergency Use Authorization (EUA). This EUA will remain  in  effect (meaning this test can be used) for the duration of the COVID-19 declaration under Section 564(b)(1) of the Act, 21 U.S.C.section 360bbb-3(b)(1), unless the authorization is terminated  or revoked sooner.       Influenza A by PCR NEGATIVE NEGATIVE Final   Influenza B by PCR NEGATIVE NEGATIVE Final    Comment: (NOTE) The Xpert Xpress SARS-CoV-2/FLU/RSV plus assay is intended as an aid in the diagnosis of influenza from Nasopharyngeal swab specimens and should not be used as a sole basis for treatment. Nasal washings and aspirates are unacceptable for Xpert Xpress SARS-CoV-2/FLU/RSV testing.  Fact Sheet for Patients: EntrepreneurPulse.com.au  Fact Sheet for Healthcare Providers: IncredibleEmployment.be  This test is not yet approved or cleared by the [place] FDA and has been authorized for detection and/or diagnosis of SARS-CoV-2 by FDA under an Emergency Use Authorization (EUA). This EUA will remain in effect (meaning this test can be used) for the duration of the COVID-19 declaration under Section 564(b)(1) of the Act, 21 U.S.C. section 360bbb-3(b)(1), unless the authorization is terminated or revoked.  Performed at [facility], [place] [address], [place], [place] [address]      Radiological Exams on Admission: DG Chest Portable 1 View  Result Date: 06/20/2021 CLINICAL DATA:  Chest pain and fever. EXAM: PORTABLE CHEST 1 VIEW COMPARISON:  PA Lat 12/13/2016. FINDINGS: The heart size and mediastinal contours are within normal limits. Both lungs are clear. The visualized skeletal structures are unremarkable. Aortic atherosclerosis. Osteopenia. IMPRESSION: No active disease. Electronically Signed   By: Telford Nab M.D.   On: 06/20/2021 06:59    EKG: Independently reviewed.  Sinus tachycardia 102 bpm  Assessment/Plan  Chest pain: Patient presents with complaints of substernal chest pressure with reports of diaphoresis,  nausea, and vomiting.  Initial high-sensitivity troponins negative and EKG without significant signs of ischemia.  Patient does note history of tobacco abuse. -Admit to a cardiac telemetry bed -Trend cardiac troponins -Check D-dimer(0.52) and lipid panel -Check echocardiogram -Follow-up echocardiogram and determine if formal consult to cardiology is warranted.  Acute respiratory failure with hypoxia secondary to suspected community-acquired pneumonia: Patient was noted to have O2 saturations reported as low as 88% placed on 2 L of nasal cannula oxygen.  He had reported coughing up green sputum and subjective fevers.  Chest x-ray was otherwise clear.  He has been started on empiric antibiotics Rocephin and azithromycin as there was concern for pneumonia.  CAP vs. Aspiration vs. possibility of PE. -Continuous pulse oximetry with nasal cannula oxygen maintain O2 saturation greater 92% -Check procalcitonin  -Incentive spirometry -Continue Rocephin and azithromycin.  De-escalate when medically appropriate -Check CT angiogram of the chest to rule out pulmonary embolus  SIRS  Leukocytosis: Acute.  Patient was noted  to be mildly tachypneic and tachycardic with WBC elevated at 12.2.  Lactic acid was reassuring at 0.8. 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 -Check UDS -Continue methadone  Tobacco abuse: Patient reports smoking a pack of cigarettes per day on average. -Nicotine patch -Continue to counsel on need of cessation of tobacco use  GERD: Patient reports having burning sensation in his chest that somewhat felt like heartburn. -Protonix  DVT prophylaxis: Lovenox Code Status: Full Family Communication: None requested Disposition Plan: Likely discharge home once medically stable Consults called: None Admission status: Inpatient, possibly  require more than another stay for IV antibiotics  Norval Morton MD [facility]   If 7PM-7AM, please contact night-coverage   06/20/2021, 8:42 AM

## 2021-06-21 LAB — BASIC METABOLIC PANEL
Anion gap: 7 (ref 5–15)
BUN: 14 mg/dL (ref 6–20)
CO2: 28 mmol/L (ref 22–32)
Calcium: 8.7 mg/dL — ABNORMAL LOW (ref 8.9–10.3)
Chloride: 103 mmol/L (ref 98–111)
Creatinine, Ser: 1.03 mg/dL (ref 0.61–1.24)
GFR, Estimated: >60 mL/min (ref >60)
Glucose, Bld: 84 mg/dL (ref 70–99)
Potassium: 4 mmol/L (ref 3.5–5.1)
Sodium: 138 mmol/L (ref 135–145)

## 2021-06-21 LAB — CBC
HCT: 38.9 % — ABNORMAL LOW (ref 39.0–52.0)
Hemoglobin: 12.9 g/dL — ABNORMAL LOW (ref 13.0–17.0)
MCH: 28.8 pg (ref 26.0–34.0)
MCHC: 33.2 g/dL (ref 30.0–36.0)
MCV: 86.8 fL (ref 80.0–100.0)
Platelets: 135 10*3/uL — ABNORMAL LOW (ref 150–400)
RBC: 4.48 MIL/uL (ref 4.22–5.81)
RDW: 14.4 % (ref 11.5–15.5)
WBC: 7.1 10*3/uL (ref 4.0–10.5)
nRBC: 0 % (ref 0.0–0.2)

## 2021-06-21 LAB — URINALYSIS, ROUTINE W REFLEX MICROSCOPIC
Bilirubin Urine: NEGATIVE
Glucose, UA: NEGATIVE mg/dL
Hgb urine dipstick: NEGATIVE
Ketones, ur: NEGATIVE mg/dL
Leukocytes,Ua: NEGATIVE
Nitrite: NEGATIVE
Protein, ur: NEGATIVE mg/dL
Specific Gravity, Urine: <1.005 — ABNORMAL LOW (ref 1.005–1.030)
pH: 7.5 (ref 5.0–8.0)

## 2021-06-21 MED ORDER — DOXYCYCLINE MONOHYDRATE 100 MG PO CAPS
100.0000 mg | ORAL_CAPSULE | Freq: Two times a day (BID) | ORAL | 0 refills | Status: DC
Start: 2021-06-21 — End: 2021-09-19

## 2021-06-21 MED ORDER — AZITHROMYCIN 250 MG PO TABS
250.0000 mg | ORAL_TABLET | Freq: Every day | ORAL | Status: DC
  Administered 2021-06-21: 250 mg via ORAL
  Filled 2021-06-21: qty 1

## 2021-06-21 MED ORDER — PANTOPRAZOLE SODIUM 40 MG PO TBEC: 40.0000 mg | DELAYED_RELEASE_TABLET | Freq: Every day | ORAL | 0 refills | Status: DC

## 2021-06-21 NOTE — TOC Transition Note (Signed)
Transition of Care [facility]) - CM/SW Discharge Note   Patient Details  Name: Jesus Glass MRN: [id number] Date of Birth: Oct 19, 1970  [facility] CM/SW Contact:  Leone Haven, RN Phone Number: 06/21/2021, 9:40 AM   Clinical Narrative:    Patient is for dc today, has no needs.          Patient Goals and CMS Choice        Discharge Placement                       Discharge Plan and Services                                     Social Determinants of Health (SDOH) Interventions     Readmission Risk Interventions No flowsheet data found.

## 2021-06-21 NOTE — Discharge Summary (Addendum)
Physician Discharge Summary   Patient: Jesus Glass MRN: [id number] DOB: 05-16-1971  Admit date:     06/20/2021  Discharge date: 06/21/21  Discharge Physician: Alberteen Sam   PCP: Irven Coe, MD   Recommendations at discharge:  Follow up with Cardiology in 3 days     Discharge Diagnoses: Principal Problem:   Chest pain Active Problems:   Patient on methadone maintenance therapy [facility])   Tobacco abuse   Bronchitis   Hospital Course: Jesus Glass is a 51 y.o. 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  In the ER, ECG unremarkable, troponins negative, admitted for chest pain rule out.  Serial troponins remained negative.  CTA chest ruled out PE and pneumonia.  Showed bronchitis.  Patient was started on antibiotics for bronchitis.  Respiratory failure ruled out.  Pneumonia ruled out.  Chest pain resolved.  HEAR score 3, low risk.  Ambulated in hallways without pain or discomfort.  Arranged short term follow up with Cardiology this Friday for risk stratification.  Discharged to complete 5 days doxycycline for bronchitis.       Pain control - [employer] Controlled Substance Reporting System database was reviewed.    Disposition: Home Diet recommendation:  Discharge Diet Orders (From admission, onward)     Start     Ordered   06/21/21 0000  Diet - low sodium heart healthy        06/21/21 0903           Regular diet  DISCHARGE MEDICATION: Allergies as of 06/21/2021       Reactions   Hydrocodone Hives   Tramadol Hives        Medication List     TAKE these medications    doxycycline 100 MG capsule Commonly known as: MONODOX Take 1 capsule (100 mg total) by mouth 2 (two) times daily.   methadone 1 MG/1ML solution Commonly known as: DOLOPHINE Take 180 mg by mouth daily.   pantoprazole 40 MG tablet Commonly known  as: PROTONIX Take 1 tablet (40 mg total) by mouth daily. Start taking on: June 22, 2021        Follow-up Information     Odis Hollingshead, Sunit, DO Follow up.   Specialties: Cardiology, Vascular Surgery Why: 11:00 AM Contact information: [address] [place] [address] [phone]         Irven Coe, MD. Go on 07/15/2021.   Specialty: Family Medicine Why: @11 :30am please arrive @11 :15am Contact information: [address] [place] [address] [phone]                Discharge Instructions     Diet - low sodium heart healthy   Complete by: As directed    Discharge instructions   Complete by: As directed    From Dr. Maryfrances Bunnell: You were admitted for chest pain. Here, after initial testing, we are confident that you didn't have a heart attack, or a blood clot in the lungs.   Your chest imaging (we performed a "CT angiogram" of the chest) showed no blood clots, no pneumonia.  You did have some mild bronchitis.  For the bronchitis, take doxycycline 100 mg twice daily for 3 more days  For the chest pain, after an event like this, we need to make sure you don't have underlying heart disease that could lead to a heart attack. You  should follow up with Dr. Odis Hollingshead, the Cardiologist this Friday at 11am His office number and address are below in the "To Do" section  Stop smoking   Increase activity slowly   Complete by: As directed        Discharge Exam: Filed Weights   06/20/21 0640 06/20/21 1205 06/21/21 0411  Weight: 102.5 kg 101.3 kg 100.7 kg   General: Pt is alert, awake, not in acute distress Cardiovascular: RRR, nl S1-S2, no murmurs appreciated.   No LE edema.   Respiratory: Normal respiratory rate and rhythm.  CTAB without rales or wheezes. Abdominal: Abdomen soft and non-tender.  No distension or HSM.   Neuro/Psych: Strength symmetric in upper and lower extremities.  Judgment and insight appear normal.   Condition at  discharge: good  The results of significant diagnostics from this hospitalization (including imaging, microbiology, ancillary and laboratory) are listed below for reference.   Imaging Studies: CT Angio Chest Pulmonary Embolism (PE) W or WO Contrast  Result Date: 06/20/2021 CLINICAL DATA:  Pulmonary embolism (PE) suspected, positive D-dimer, chest pain, fever EXAM: CT ANGIOGRAPHY CHEST WITH CONTRAST TECHNIQUE: Multidetector CT imaging of the chest was performed using the standard protocol during bolus administration of intravenous contrast. Multiplanar CT image reconstructions and MIPs were obtained to evaluate the vascular anatomy. RADIATION DOSE REDUCTION: This exam was performed according to the departmental dose-optimization program which includes automated exposure control, adjustment of the mA and/or kV according to patient size and/or use of iterative reconstruction technique. CONTRAST:  37mL OMNIPAQUE IOHEXOL 350 MG/ML SOLN COMPARISON:  06/20/2021 FINDINGS: Cardiovascular: This is a technically adequate evaluation of the pulmonary vasculature. No filling defects or pulmonary emboli. The heart is unremarkable without pericardial effusion. No evidence of thoracic aortic aneurysm or dissection. Mediastinum/Nodes: No enlarged mediastinal, hilar, or axillary lymph nodes. Thyroid gland, trachea, and esophagus demonstrate no significant findings. Small hiatal hernia. Lungs/Pleura: Dependent linear areas of consolidation within the lower lobes are likely related to hypoventilatory change and subsegmental atelectasis. Mosaic attenuation pattern with scattered ground-glass opacities in the upper lobe could reflect areas of air trapping or small airway disease. There is mild upper lobe predominant bronchial wall thickening. No effusion or pneumothorax. The central airways are patent. Upper Abdomen: Probable splenomegaly, measuring 16 cm in anterior-posterior dimension. Spleen is incompletely evaluated on this  study due to slice selection. No other acute upper abdominal findings. Musculoskeletal: No acute or destructive bony lesions. Reconstructed images demonstrate no additional findings. Review of the MIP images confirms the above findings. IMPRESSION: 1. No evidence of pulmonary embolus. 2. Upper lobe predominant bronchial wall thickening, with scattered ground-glass opacities, most consistent with bronchitis. 3. Bilateral lower lobe subsegmental atelectasis. 4. Probable splenomegaly, incompletely evaluated on this study. 5. Small hiatal hernia. Electronically Signed   By: Sharlet Salina M.D.   On: 06/20/2021 18:35   DG Chest Portable 1 View  Result Date: 06/20/2021 CLINICAL DATA:  Chest pain and fever. EXAM: PORTABLE CHEST 1 VIEW COMPARISON:  PA Lat 12/13/2016. FINDINGS: The heart size and mediastinal contours are within normal limits. Both lungs are clear. The visualized skeletal structures are unremarkable. Aortic atherosclerosis. Osteopenia. IMPRESSION: No active disease. Electronically Signed   By: Almira Bar M.D.   On: 06/20/2021 06:59   ECHOCARDIOGRAM COMPLETE  Result Date: 06/20/2021    ECHOCARDIOGRAM REPORT   Patient Name:   MERVILLE HIJAZI Date of Exam: 06/20/2021 Medical Rec #:  [id number]          Height:  71.0 in Accession #:    [id number]         Weight:       223.3 lb Date of Birth:  22-Oct-1970         BSA:          2.210 m Patient Age:    50 years           BP:           125/88 mmHg Patient Gender: M                  HR:           79 bpm. Exam Location:  Inpatient Procedure: 2D Echo Indications:    Chest pain  History:        Patient has no prior history of Echocardiogram examinations.  Sonographer:    Devonne Doughty Referring Phys: [id number] RONDELL A SMITH IMPRESSIONS  1. Left ventricular ejection fraction, by estimation, is 60 to 65%. The left ventricle has normal function. The left ventricle has no regional wall motion abnormalities. Left ventricular diastolic parameters were normal.   2. Right ventricular systolic function is normal. The right ventricular size is normal.  3. The mitral valve is normal in structure. No evidence of mitral valve regurgitation. No evidence of mitral stenosis.  4. The aortic valve is normal in structure. Aortic valve regurgitation is not visualized. No aortic stenosis is present.  5. There is borderline dilatation of the aortic root, measuring 37 mm.  6. The inferior vena cava is normal in size with greater than 50% respiratory variability, suggesting right atrial pressure of 3 mmHg. Comparison(s): No prior Echocardiogram. FINDINGS  Left Ventricle: Left ventricular ejection fraction, by estimation, is 60 to 65%. The left ventricle has normal function. The left ventricle has no regional wall motion abnormalities. The left ventricular internal cavity size was normal in size. There is  no left ventricular hypertrophy. Left ventricular diastolic parameters were normal. Right Ventricle: The right ventricular size is normal. No increase in right ventricular wall thickness. Right ventricular systolic function is normal. Left Atrium: Left atrial size was normal in size. Right Atrium: Right atrial size was normal in size. Pericardium: There is no evidence of pericardial effusion. Mitral Valve: The mitral valve is normal in structure. No evidence of mitral valve regurgitation. No evidence of mitral valve stenosis. Tricuspid Valve: The tricuspid valve is normal in structure. Tricuspid valve regurgitation is not demonstrated. No evidence of tricuspid stenosis. Aortic Valve: The aortic valve is normal in structure. Aortic valve regurgitation is not visualized. No aortic stenosis is present. Aortic valve mean gradient measures 3.0 mmHg. Aortic valve peak gradient measures 6.7 mmHg. Aortic valve area, by VTI measures 2.82 cm. Pulmonic Valve: The pulmonic valve was normal in structure. Pulmonic valve regurgitation is not visualized. No evidence of pulmonic stenosis. Aorta: There is  borderline dilatation of the aortic root, measuring 37 mm. Venous: The inferior vena cava is normal in size with greater than 50% respiratory variability, suggesting right atrial pressure of 3 mmHg. IAS/Shunts: No atrial level shunt detected by color flow Doppler.  LEFT VENTRICLE PLAX 2D LVIDd:         4.90 cm   Diastology LVIDs:         3.10 cm   LV e' medial:    8.81 cm/s LV PW:         0.90 cm   LV E/e' medial:  8.6 LV IVS:  0.90 cm   LV e' lateral:   10.60 cm/s LVOT diam:     2.00 cm   LV E/e' lateral: 7.1 LV SV:         78 LV SV Index:   35 LVOT Area:     3.14 cm  RIGHT VENTRICLE             IVC RV Basal diam:  3.60 cm     IVC diam: 1.80 cm RV Mid diam:    3.20 cm RV S prime:     14.80 cm/s TAPSE (M-mode): 3.0 cm LEFT ATRIUM             Index        RIGHT ATRIUM           Index LA diam:        3.50 cm 1.58 cm/m   RA Area:     17.60 cm LA Vol (A2C):   49.6 ml 22.45 ml/m  RA Volume:   45.40 ml  20.55 ml/m LA Vol (A4C):   32.3 ml 14.62 ml/m LA Biplane Vol: 43.2 ml 19.55 ml/m  AORTIC VALVE AV Area (Vmax):    3.07 cm AV Area (Vmean):   2.99 cm AV Area (VTI):     2.82 cm AV Vmax:           129.00 cm/s AV Vmean:          85.700 cm/s AV VTI:            0.277 m AV Peak Grad:      6.7 mmHg AV Mean Grad:      3.0 mmHg LVOT Vmax:         126.00 cm/s LVOT Vmean:        81.500 cm/s LVOT VTI:          0.249 m LVOT/AV VTI ratio: 0.90  AORTA Ao Root diam: 3.70 cm Ao Asc diam:  3.40 cm MITRAL VALVE MV Area (PHT): 3.27 cm    SHUNTS MV Decel Time: 232 msec    Systemic VTI:  0.25 m MV E velocity: 75.60 cm/s  Systemic Diam: 2.00 cm MV A velocity: 61.00 cm/s MV E/A ratio:  1.24 Kardie Tobb DO Electronically signed by Thomasene RippleKardie Tobb DO Signature Date/Time: 06/20/2021/4:12:10 PM    Final     Microbiology: Results for orders placed or performed during the hospital encounter of 06/20/21  Resp Panel by RT-PCR (Flu A&B, Covid) Nasopharyngeal Swab     Status: None   Collection Time: 06/20/21  6:51 AM   Specimen:  Nasopharyngeal Swab; Nasopharyngeal(NP) swabs in vial transport medium  Result Value Ref Range Status   SARS Coronavirus 2 by RT PCR NEGATIVE NEGATIVE Final    Comment: (NOTE) SARS-CoV-2 target nucleic acids are NOT DETECTED.  The SARS-CoV-2 RNA is generally detectable in upper respiratory specimens during the acute phase of infection. The lowest concentration of SARS-CoV-2 viral copies this assay can detect is 138 copies/mL. A negative result does not preclude SARS-Cov-2 infection and should not be used as the sole basis for treatment or other patient management decisions. A negative result may occur with  improper specimen collection/handling, submission of specimen other than nasopharyngeal swab, presence of viral mutation(s) within the areas targeted by this assay, and inadequate number of viral copies(<138 copies/mL). A negative result must be combined with clinical observations, patient history, and epidemiological information. The expected result is Negative.  Fact Sheet for Patients:  BloggerCourse.comhttps://www.fda.gov/media/152166/download  Fact Sheet for Healthcare  Providers:  SeriousBroker.ithttps://www.fda.gov/media/152162/download  This test is no t yet approved or cleared by the [place] FDA and  has been authorized for detection and/or diagnosis of SARS-CoV-2 by FDA under an Emergency Use Authorization (EUA). This EUA will remain  in effect (meaning this test can be used) for the duration of the COVID-19 declaration under Section 564(b)(1) of the Act, 21 U.S.C.section 360bbb-3(b)(1), unless the authorization is terminated  or revoked sooner.       Influenza A by PCR NEGATIVE NEGATIVE Final   Influenza B by PCR NEGATIVE NEGATIVE Final    Comment: (NOTE) The Xpert Xpress SARS-CoV-2/FLU/RSV plus assay is intended as an aid in the diagnosis of influenza from Nasopharyngeal swab specimens and should not be used as a sole basis for treatment. Nasal washings and aspirates are unacceptable for  Xpert Xpress SARS-CoV-2/FLU/RSV testing.  Fact Sheet for Patients: BloggerCourse.comhttps://www.fda.gov/media/152166/download  Fact Sheet for Healthcare Providers: SeriousBroker.ithttps://www.fda.gov/media/152162/download  This test is not yet approved or cleared by the [place] FDA and has been authorized for detection and/or diagnosis of SARS-CoV-2 by FDA under an Emergency Use Authorization (EUA). This EUA will remain in effect (meaning this test can be used) for the duration of the COVID-19 declaration under Section 564(b)(1) of the Act, 21 U.S.C. section 360bbb-3(b)(1), unless the authorization is terminated or revoked.  Performed at [facility], [address] [place], [place] [address]   Blood Culture (routine x 2)     Status: None (Preliminary result)   Collection Time: 06/20/21  8:22 AM   Specimen: BLOOD  Result Value Ref Range Status   Specimen Description BLOOD RIGHT ANTECUBITAL  Final   Special Requests   Final    BOTTLES DRAWN AEROBIC AND ANAEROBIC Blood Culture adequate volume   Culture   Final    NO GROWTH 1 DAY Performed at [facility], [address] [place], [place] [address]    Report Status PENDING  Incomplete  Blood Culture (routine x 2)     Status: None (Preliminary result)   Collection Time: 06/20/21  8:27 AM   Specimen: BLOOD RIGHT HAND  Result Value Ref Range Status   Specimen Description BLOOD RIGHT HAND  Final   Special Requests   Final    BOTTLES DRAWN AEROBIC AND ANAEROBIC Blood Culture adequate volume   Culture   Final    NO GROWTH 1 DAY Performed at [facility], [address] [place], [place] [address]    Report Status PENDING  Incomplete    Labs: CBC: Recent Labs  Lab 06/20/21 0635 06/21/21 0148  WBC 12.2* 7.1  HGB 14.4 12.9*  HCT 44.2 38.9*  MCV 86.5 86.8  PLT 166 135*   Basic Metabolic Panel: Recent Labs  Lab 06/20/21 0635 06/21/21 0148  NA 138 138  K 4.0 4.0  CL 103 103  CO2 24 28  GLUCOSE 92 84  BUN 14 14   CREATININE 1.05 1.03  CALCIUM 9.2 8.7*   Liver Function Tests: No results for input(s): AST, ALT, ALKPHOS, BILITOT, PROT, ALBUMIN in the last 168 hours. CBG: No results for input(s): GLUCAP in the last 168 hours.  Discharge time spent: 40 minutes.  Signed: Alberteen Samhristopher P Kailan Carmen, MD [facility] 06/21/2021

## 2021-06-21 NOTE — Plan of Care (Signed)
  Problem: Education: Goal: Knowledge of General Education information will improve Description: Including pain rating scale, medication(s)/side effects and non-pharmacologic comfort measures Outcome: Progressing   Problem: Health Behavior/Discharge Planning: Goal: Ability to manage health-related needs will improve Outcome: Progressing   Problem: Clinical Measurements: Goal: Respiratory complications will improve Outcome: Progressing   

## 2021-06-24 ENCOUNTER — Ambulatory Visit: Payer: Medicaid Other | Admitting: Cardiology

## 2021-06-25 LAB — CULTURE, BLOOD (ROUTINE X 2)
Culture: NO GROWTH
Culture: NO GROWTH
Special Requests: ADEQUATE
Special Requests: ADEQUATE

## 2021-08-17 ENCOUNTER — Other Ambulatory Visit: Payer: Self-pay | Admitting: Family Medicine

## 2021-08-17 DIAGNOSIS — Z720 Tobacco use: Secondary | ICD-10-CM

## 2021-09-02 ENCOUNTER — Ambulatory Visit
Admission: RE | Admit: 2021-09-02 | Discharge: 2021-09-02 | Disposition: A | Discharge status: Home / Self Care | Payer: Managed Care, Other (non HMO) | Source: Ambulatory Visit | Attending: Family Medicine | Admitting: Family Medicine

## 2021-09-02 DIAGNOSIS — Z720 Tobacco use: Secondary | ICD-10-CM

## 2021-09-19 ENCOUNTER — Ambulatory Visit (INDEPENDENT_AMBULATORY_CARE_PROVIDER_SITE_OTHER): Payer: 59

## 2021-09-19 ENCOUNTER — Encounter (HOSPITAL_COMMUNITY): Payer: Self-pay | Admitting: Emergency Medicine

## 2021-09-19 ENCOUNTER — Ambulatory Visit (HOSPITAL_COMMUNITY)
Admission: EM | Admit: 2021-09-19 | Discharge: 2021-09-19 | Disposition: A | Discharge status: Home / Self Care | Payer: 59 | Attending: Family Medicine | Admitting: Family Medicine

## 2021-09-19 DIAGNOSIS — Z20822 Contact with and (suspected) exposure to covid-19: Secondary | ICD-10-CM | POA: Insufficient documentation

## 2021-09-19 DIAGNOSIS — G253 Myoclonus: Secondary | ICD-10-CM | POA: Diagnosis not present

## 2021-09-19 DIAGNOSIS — J441 Chronic obstructive pulmonary disease with (acute) exacerbation: Secondary | ICD-10-CM | POA: Diagnosis not present

## 2021-09-19 DIAGNOSIS — R194 Change in bowel habit: Secondary | ICD-10-CM

## 2021-09-19 DIAGNOSIS — R0602 Shortness of breath: Secondary | ICD-10-CM | POA: Insufficient documentation

## 2021-09-19 DIAGNOSIS — R0600 Dyspnea, unspecified: Secondary | ICD-10-CM

## 2021-09-19 DIAGNOSIS — R062 Wheezing: Secondary | ICD-10-CM

## 2021-09-19 LAB — CBC WITH DIFFERENTIAL/PLATELET
Abs Immature Granulocytes: 0.07 10*3/uL (ref 0.00–0.07)
Basophils Absolute: 0.1 10*3/uL (ref 0.0–0.1)
Basophils Relative: 1 %
Eosinophils Absolute: 0.4 10*3/uL (ref 0.0–0.5)
Eosinophils Relative: 3 %
HCT: 42.3 % (ref 39.0–52.0)
Hemoglobin: 13.7 g/dL (ref 13.0–17.0)
Immature Granulocytes: 1 %
Lymphocytes Relative: 9 %
Lymphs Abs: 1.1 10*3/uL (ref 0.7–4.0)
MCH: 29 pg (ref 26.0–34.0)
MCHC: 32.4 g/dL (ref 30.0–36.0)
MCV: 89.6 fL (ref 80.0–100.0)
Monocytes Absolute: 0.8 10*3/uL (ref 0.1–1.0)
Monocytes Relative: 6 %
Neutro Abs: 10.1 10*3/uL — ABNORMAL HIGH (ref 1.7–7.7)
Neutrophils Relative %: 80 %
Platelets: 154 10*3/uL (ref 150–400)
RBC: 4.72 MIL/uL (ref 4.22–5.81)
RDW: 13.8 % (ref 11.5–15.5)
WBC: 12.5 10*3/uL — ABNORMAL HIGH (ref 4.0–10.5)
nRBC: 0 % (ref 0.0–0.2)

## 2021-09-19 LAB — COMPREHENSIVE METABOLIC PANEL
ALT: 11 U/L (ref 0–44)
AST: 19 U/L (ref 15–41)
Albumin: 3.7 g/dL (ref 3.5–5.0)
Alkaline Phosphatase: 117 U/L (ref 38–126)
Anion gap: 8 (ref 5–15)
BUN: 11 mg/dL (ref 6–20)
CO2: 27 mmol/L (ref 22–32)
Calcium: 8.9 mg/dL (ref 8.9–10.3)
Chloride: 104 mmol/L (ref 98–111)
Creatinine, Ser: 1.14 mg/dL (ref 0.61–1.24)
GFR, Estimated: >60 mL/min (ref >60)
Glucose, Bld: 104 mg/dL — ABNORMAL HIGH (ref 70–99)
Potassium: 4.4 mmol/L (ref 3.5–5.1)
Sodium: 139 mmol/L (ref 135–145)
Total Bilirubin: 0.5 mg/dL (ref 0.3–1.2)
Total Protein: 7.7 g/dL (ref 6.5–8.1)

## 2021-09-19 LAB — SARS CORONAVIRUS 2 (TAT 6-24 HRS): SARS Coronavirus 2: NEGATIVE

## 2021-09-19 MED ORDER — PREDNISONE 20 MG PO TABS: 40.0000 mg | ORAL_TABLET | Freq: Every day | ORAL | 0 refills | Status: AC

## 2021-09-19 MED ORDER — ALBUTEROL SULFATE HFA 108 (90 BASE) MCG/ACT IN AERS: 1.0000 | INHALATION_SPRAY | RESPIRATORY_TRACT | 0 refills | Status: AC | PRN

## 2021-09-19 MED ORDER — DOXYCYCLINE HYCLATE 100 MG PO CAPS: 100.0000 mg | ORAL_CAPSULE | Freq: Two times a day (BID) | ORAL | 0 refills | Status: AC

## 2021-09-19 NOTE — Discharge Instructions (Addendum)
Your chest x-ray did not show fluid or an obvious pneumonia.  There was evidence of bronchitis or COPD changes.  There is also possibly "atelectasis"; this could be where you are not expanding the air spaces of your lung fully.  You still should have a follow-up chest x-ray or other imaging to make sure that that is gone. ? ?Staff are going to draw a blood count and a chemistry panel today. ? ?You have been swabbed for COVID, and the test will result in the next 24 hours. Our staff will call you if positive. If the test is positive, you should quarantine for 5 days.  ? ?Take doxycycline 100 mg--1 capsule 2 times daily for 7 days ?Take prednisone 20 mg--2 tabs daily for 5 days ? ?Albuterol inhaler--do 2 puffs every 4 hours as needed for shortness of breath or wheezing  ? ?See your primary care provider about the following ? ?67-month history of shortness of breath ? ?31-month history of constipation and bowel habit changes; a reminder, this did not start when you started your methadone ? ?Jerking motions that you do ? ?Wheezing ? ?

## 2021-09-19 NOTE — ED Triage Notes (Signed)
For a week having SOB, cough that is dry.  ?Pt reports that he is very constipated. Last BM was Saturday night and stool was very hard.Marland Kitchen  ?Pt reports that he always sweats a lot his whole life. ?Pt reports takes methadone and is on a high dose. Friday at work he passed out.  ?

## 2021-09-19 NOTE — ED Provider Notes (Addendum)
[facility]    CSN: [id number] Arrival date & time: 09/19/21  0945      History   Chief Complaint Chief Complaint  Patient presents with   Cough   Shortness of Breath    HPI Jesus Glass is a 51 y.o. male.    Cough Associated symptoms: shortness of breath   Shortness of Breath Associated symptoms: cough   Here for dyspnea that is been going on for about 2 months.  His wife states that he coughs and sounds short of breath all night every night.  No recent fever or chills.  He did feel worse on April 28.  He was at work sitting down and passed out for 3 or 4 minutes.  No head injury.  Also on that same day he had had some emesis.  Wife states he also threw up overnight last night.  He is constipated.  States that MiraLAX is not helping.  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  He does have a primary care doctor  Past Medical History:  Diagnosis Date   Asthma    Chronic back pain    Heroin addiction [facility])     Patient Active Problem List   Diagnosis Date Noted   Chest pain 06/20/2021   Patient on methadone maintenance therapy (HCC) 06/20/2021   SIRS (systemic inflammatory response syndrome) (HCC) 06/20/2021   Tobacco abuse 06/20/2021   Leukocytosis 06/20/2021   Community acquired pneumonia 06/20/2021   Aspiration pneumonia of right lower lobe due to vomit (HCC) 12/13/2016   Overdose of opiate or related narcotic, accidental or unintentional, initial encounter (HCC) 12/13/2016   Acute respiratory failure with hypoxia (HCC) 12/13/2016    Past Surgical History:  Procedure Laterality Date   HAND SURGERY         Home Medications    Prior to Admission medications   Medication Sig Start Date End Date Taking? Authorizing Provider  albuterol (VENTOLIN HFA) 108 (90 Base) MCG/ACT inhaler Inhale 1-2 puffs into the lungs every 4 (four) hours as needed for wheezing or shortness of breath. 09/19/21  Yes Fronnie Urton, Janace Aris, MD  doxycycline  (VIBRAMYCIN) 100 MG capsule Take 1 capsule (100 mg total) by mouth 2 (two) times daily for 7 days. 09/19/21 09/26/21 Yes Javier Gell, Janace Aris, MD  predniSONE (DELTASONE) 20 MG tablet Take 2 tablets (40 mg total) by mouth daily with breakfast for 5 days. 09/19/21 09/24/21 Yes Zenia Resides, MD  methadone (DOLOPHINE) 1 MG/1ML solution Take 180 mg by mouth daily.    [provider]    Family History No family history on file.  Social History Social History   Tobacco Use   Smoking status: Every Day    Packs/day: 0.50    Types: Cigarettes   Smokeless tobacco: Never  Substance Use Topics   Alcohol use: No   Drug use: Yes    Types: Methamphetamines    Comment: reports he's in recovery x3 years     Allergies   Hydrocodone and Tramadol   Review of Systems Review of Systems  Respiratory:  Positive for cough and shortness of breath.     Physical Exam Triage Vital Signs ED Triage Vitals  Enc Vitals Group     BP 09/19/21 0951 (!) 138/97     Pulse Rate 09/19/21 0951 (!) 106     Resp 09/19/21 0951 20     Temp 09/19/21 0951 98.9 F (37.2 C)     Temp Source  09/19/21 0951 Oral     SpO2 09/19/21 0951 95 %     Weight --      Height --      Head Circumference --      Peak Flow --      Pain Score 09/19/21 0950 0     Pain Loc --      Pain Edu? --      Excl. in [place]? --    No data found.  Updated Vital Signs BP (!) 138/97 (BP Location: Right Arm)   Pulse (!) 106   Temp 98.9 F (37.2 C) (Oral)   Resp 20   SpO2 95%   Visual Acuity Right Eye Distance:   Left Eye Distance:   Bilateral Distance:    Right Eye Near:   Left Eye Near:    Bilateral Near:     Physical Exam Vitals and nursing note reviewed.  Constitutional:      General: He is not in acute distress.    Appearance: He is not ill-appearing, toxic-appearing or diaphoretic.     Comments: Initially when I entered the room, he was asleep flat on his back on the table.  He awakens easily and is alert and oriented  in the room.  HENT:     Mouth/Throat:     Mouth: Mucous membranes are moist.     Pharynx: No oropharyngeal exudate or posterior oropharyngeal erythema.  Eyes:     Extraocular Movements: Extraocular movements intact.     Conjunctiva/sclera: Conjunctivae normal.     Pupils: Pupils are equal, round, and reactive to light.  Cardiovascular:     Rate and Rhythm: Normal rate and regular rhythm.     Heart sounds: No murmur heard. Pulmonary:     Effort: Pulmonary effort is normal.     Breath sounds: Wheezing (There is bilateral low pitched wheezes with mildly prolonged expiratory phase) present.  Abdominal:     Palpations: Abdomen is soft. There is no mass.     Tenderness: There is no abdominal tenderness.  Musculoskeletal:     Cervical back: Neck supple.  Lymphadenopathy:     Cervical: No cervical adenopathy.  Skin:    Coloration: Skin is not jaundiced or pale.  Neurological:     Mental Status: He is oriented to person, place, and time.  Psychiatric:        Behavior: Behavior normal.     UC Treatments / Results  Labs (all labs ordered are listed, but only abnormal results are displayed) Labs Reviewed - No data to display  EKG   Radiology DG Chest 2 View  Result Date: 09/19/2021 CLINICAL DATA:  Dyspnea, wheezing x2 months EXAM: CHEST - 2 VIEW COMPARISON:  Previous studies including the examination of 12/13/2016 FINDINGS: Cardiac size is within normal limits. There is peribronchial thickening. Linear densities seen in the medial lower lung fields. There is no focal consolidation. There is no pleural effusion or pneumothorax. IMPRESSION: Peribronchial thickening suggests bronchitis. Linear densities seen in the medial lower lung fields suggesting scarring or subsegmental atelectasis. There is no focal pulmonary consolidation. There is no pleural effusion. Electronically Signed   By: Ernie Avena M.D.   On: 09/19/2021 11:21    Procedures Procedures (including critical care  time)  Medications Ordered in UC Medications - No data to display  Initial Impression / Assessment and Plan / UC Course  I have reviewed the triage vital signs and the nursing notes.  Pertinent labs & imaging results that were available  during my care of the patient were reviewed by me and considered in my medical decision making (see chart for details).     CXR shows some peribronchial thickening and some atelectasis. No mass or fluid on interpretation. We will do some basic lab, and treat for poss copd exacerbation.  We will also swab for COVID; if positive, he should have a rx for molnupiravir. Final Clinical Impressions(s) / UC Diagnoses   Final diagnoses:  COPD exacerbation (HCC)  Bowel habit changes  Myoclonic jerking     Discharge Instructions      Your chest x-ray did not show fluid or an obvious pneumonia.  There was evidence of bronchitis or COPD changes.  There is also possibly "atelectasis"; this could be where you are not expanding the air spaces of your lung fully.  You still should have a follow-up chest x-ray or other imaging to make sure that that is gone.  Staff are going to draw a blood count and a chemistry panel today.  You have been swabbed for COVID, and the test will result in the next 24 hours. Our staff will call you if positive. If the test is positive, you should quarantine for 5 days.   Take doxycycline 100 mg--1 capsule 2 times daily for 7 days Take prednisone 20 mg--2 tabs daily for 5 days  Albuterol inhaler--do 2 puffs every 4 hours as needed for shortness of breath or wheezing   See your primary care provider about the following  60-month history of shortness of breath  10-month history of constipation and bowel habit changes; a reminder, this did not start when you started your methadone  Jerking motions that you do  Wheezing      ED Prescriptions     Medication Sig Dispense Auth. Provider   doxycycline (VIBRAMYCIN) 100 MG  capsule Take 1 capsule (100 mg total) by mouth 2 (two) times daily for 7 days. 14 capsule Zenia Resides, MD   predniSONE (DELTASONE) 20 MG tablet Take 2 tablets (40 mg total) by mouth daily with breakfast for 5 days. 10 tablet Latiesha Harada, Janace Aris, MD   albuterol (VENTOLIN HFA) 108 (90 Base) MCG/ACT inhaler Inhale 1-2 puffs into the lungs every 4 (four) hours as needed for wheezing or shortness of breath. 1 each Zenia Resides, MD      I have reviewed the PDMP during this encounter.   Zenia Resides, MD 09/19/21 1151    Zenia Resides, MD 09/19/21 [phone]

## 2021-09-27 ENCOUNTER — Encounter (HOSPITAL_COMMUNITY): Payer: Self-pay | Admitting: Emergency Medicine

## 2021-09-27 ENCOUNTER — Ambulatory Visit (HOSPITAL_COMMUNITY)
Admission: EM | Admit: 2021-09-27 | Discharge: 2021-09-27 | Disposition: A | Discharge status: Home / Self Care | Payer: Commercial Managed Care - HMO | Attending: Physician Assistant | Admitting: Physician Assistant

## 2021-09-27 DIAGNOSIS — R112 Nausea with vomiting, unspecified: Secondary | ICD-10-CM

## 2021-09-27 DIAGNOSIS — K591 Functional diarrhea: Secondary | ICD-10-CM | POA: Diagnosis not present

## 2021-09-27 DIAGNOSIS — G9331 Postviral fatigue syndrome: Secondary | ICD-10-CM | POA: Diagnosis not present

## 2021-09-27 MED ORDER — ONDANSETRON 4 MG PO TBDP
4.0000 mg | ORAL_TABLET | Freq: Once | ORAL | Status: AC
  Administered 2021-09-27: 4 mg via ORAL

## 2021-09-27 MED ORDER — ONDANSETRON 4 MG PO TBDP
ORAL_TABLET | ORAL | Status: AC
  Filled 2021-09-27: qty 1

## 2021-09-27 NOTE — ED Provider Notes (Signed)
?[facility] ? ? ? ?CSN: [id number] ?Arrival date & time: 09/27/21  1252 ? ? ?  ? ?History   ?Chief Complaint ?Chief Complaint  ?Patient presents with  ? Emesis  ? Diarrhea  ? ? ?HPI ?Jesus Glass is a 51 y.o. male.  ? ?51 year old male presents with nausea, vomiting, diarrhea.  Indicates for the past 2 days he is having recurrent episodes of abdominal cramping, pain, nausea, with repeated episodes of vomiting.  Patient indicates he also has loose bowels, diarrhea, watery, infrequent.  Patient indicates he does not have any fever, and no chills.  Patient relates he is tolerating fluids well.  Patient indicates he started with the virus right after his 69-month old child started they both caught the virus from their older son. ?Patient relates he is taking methadone on a regular basis.  Patient indicates he has been having increased profuse sweating for the past several months.  Patient does have a PCP that he is followed by. ? ? ? ?Past Medical History:  ?Diagnosis Date  ? Asthma   ? Chronic back pain   ? Heroin addiction ([place])   ? ? ?Patient Active Problem List  ? Diagnosis Date Noted  ? Chest pain 06/20/2021  ? Patient on methadone maintenance therapy ([place]) 06/20/2021  ? SIRS (systemic inflammatory response syndrome) ([place]) 06/20/2021  ? Tobacco abuse 06/20/2021  ? Leukocytosis 06/20/2021  ? Community acquired pneumonia 06/20/2021  ? Aspiration pneumonia of right lower lobe due to vomit ([place]) 12/13/2016  ? Overdose of opiate or related narcotic, accidental or unintentional, initial encounter ([place]) 12/13/2016  ? Acute respiratory failure with hypoxia ([place]) 12/13/2016  ? ? ?Past Surgical History:  ?Procedure Laterality Date  ? HAND SURGERY    ? ? ? ? ? ?Home Medications   ? ?Prior to Admission medications   ?Medication Sig Start Date End Date Taking? Authorizing Provider  ?albuterol (VENTOLIN HFA) 108 (90 Base) MCG/ACT inhaler Inhale 1-2 puffs into the lungs every 4 (four) hours as needed for wheezing  or shortness of breath. 09/19/21   Barrett Henle, MD  ?methadone (DOLOPHINE) 1 MG/1ML solution Take 180 mg by mouth daily.    [provider]  ? ? ?Family History ?No family history on file. ? ?Social History ?Social History  ? ?Tobacco Use  ? Smoking status: Every Day  ?  Packs/day: 0.50  ?  Types: Cigarettes  ? Smokeless tobacco: Never  ?Substance Use Topics  ? Alcohol use: No  ? Drug use: Yes  ?  Types: Methamphetamines  ?  Comment: reports he's in recovery x3 years  ? ? ? ?Allergies   ?Hydrocodone and Tramadol ? ? ?Review of Systems ?Review of Systems  ?Gastrointestinal:  Positive for diarrhea, nausea and vomiting.  ? ? ?Physical Exam ?Triage Vital Signs ?ED Triage Vitals  ?Enc Vitals Group  ?   BP   ?   Pulse   ?   Resp   ?   Temp   ?   Temp src   ?   SpO2   ?   Weight   ?   Height   ?   Head Circumference   ?   Peak Flow   ?   Pain Score   ?   Pain Loc   ?   Pain Edu?   ?   Excl. in [place]?   ? ?No data found. ? ?Updated Vital Signs ?BP 130/90 (BP Location: Left Arm)   Pulse Marland Kitchen)  107   Temp 99 ?F (37.2 ?C) (Oral)   Resp 20   Wt 223 lb 6.4 oz (101.3 kg)   SpO2 93%   BMI 31.16 kg/m?  ? ?Visual Acuity ?Right Eye Distance:   ?Left Eye Distance:   ?Bilateral Distance:   ? ?Right Eye Near:   ?Left Eye Near:    ?Bilateral Near:    ? ?Physical Exam ?Constitutional:   ?   Appearance: Normal appearance.  ?HENT:  ?   Right Ear: Tympanic membrane and ear canal normal.  ?   Left Ear: Tympanic membrane and ear canal normal.  ?   Mouth/Throat:  ?   Mouth: Mucous membranes are moist.  ?   Pharynx: Oropharynx is clear.  ?   Comments: Mouth: We are without any redness ?Neck:  ?   Comments: Neck: No lymphadenopathy noted bilaterally ?Cardiovascular:  ?   Rate and Rhythm: Normal rate and regular rhythm.  ?Pulmonary:  ?   Effort: Pulmonary effort is normal.  ?   Breath sounds: Normal air entry. Examination of the right-lower field reveals rhonchi. Examination of the left-lower field reveals rhonchi. Rhonchi present.   ?   Comments: Lungs: Normal breath sounds with mild rhonchi present bilateral lower lobes, no Rales, no wheezing. ?Abdominal:  ?   General: Abdomen is flat. Bowel sounds are normal.  ?   Palpations: Abdomen is soft.  ?   Tenderness: There is no abdominal tenderness.  ?Musculoskeletal:  ?   Cervical back: Normal range of motion.  ?Neurological:  ?   Mental Status: He is alert.  ? ? ? ?UC Treatments / Results  ?Labs ?(all labs ordered are listed, but only abnormal results are displayed) ?Labs Reviewed - No data to display ? ?EKG ? ? ?Radiology ?No results found. ? ?Procedures ?Procedures (including critical care time) ? ?Medications Ordered in UC ?Medications  ?ondansetron (ZOFRAN-ODT) disintegrating tablet 4 mg (4 mg Oral Given 09/27/21 1441)  ? ? ?Initial Impression / Assessment and Plan / UC Course  ?I have reviewed the triage vital signs and the nursing notes. ? ?Pertinent labs & imaging results that were available during my care of the patient were reviewed by me and considered in my medical decision making (see chart for details). ? ?  ?Plan: ?Given a Zofran 4 mg tablet in the office. ?Patient advised to continue bland diet, increase fluid intake. ?Patient given work note for 2 days off work. ?Patient advised to follow-up with PCP. ?Final Clinical Impressions(s) / UC Diagnoses  ? ?Final diagnoses:  ?Nausea and vomiting, unspecified vomiting type  ?Functional diarrhea  ?Postviral fatigue syndrome  ? ? ? ?Discharge Instructions   ? ?  ?Advised clear liquid diet, chicken broth, beef broth, gradually working to bland solid foods over the next 48 hours. ?Patient advised to take the medication as directed for nausea. ?Advised to return over the next 7 days if symptoms fail to improve or worsen. ? ? ? ? ?ED Prescriptions   ?None ?  ? ?PDMP not reviewed this encounter. ?  ?Nyoka Lint, PA-C ?09/27/21 1443 ? ?

## 2021-09-27 NOTE — ED Triage Notes (Signed)
Pt was exposed to child that had GI bug over the weekend. Pt had n/v/d all night and day today. Needing note for being out of work.  ?

## 2021-09-27 NOTE — Discharge Instructions (Signed)
Advised clear liquid diet, chicken broth, beef broth, gradually working to bland solid foods over the next 48 hours. ?Patient advised to take the medication as directed for nausea. ?Advised to return over the next 7 days if symptoms fail to improve or worsen. ?

## 2021-10-28 ENCOUNTER — Other Ambulatory Visit: Payer: Self-pay | Admitting: Family Medicine

## 2021-10-28 DIAGNOSIS — R918 Other nonspecific abnormal finding of lung field: Secondary | ICD-10-CM

## 2023-03-25 ENCOUNTER — Emergency Department (HOSPITAL_COMMUNITY)
Admission: EM | Admit: 2023-03-25 | Discharge: 2023-03-25 | Disposition: A | Discharge status: Home / Self Care | Payer: MEDICAID | Attending: Emergency Medicine | Admitting: Emergency Medicine

## 2023-03-25 ENCOUNTER — Emergency Department (HOSPITAL_COMMUNITY): Payer: MEDICAID

## 2023-03-25 ENCOUNTER — Other Ambulatory Visit: Payer: Self-pay

## 2023-03-25 ENCOUNTER — Encounter (HOSPITAL_COMMUNITY): Payer: Self-pay

## 2023-03-25 DIAGNOSIS — S40022A Contusion of left upper arm, initial encounter: Secondary | ICD-10-CM | POA: Diagnosis not present

## 2023-03-25 DIAGNOSIS — M542 Cervicalgia: Secondary | ICD-10-CM | POA: Insufficient documentation

## 2023-03-25 DIAGNOSIS — S0101XA Laceration without foreign body of scalp, initial encounter: Secondary | ICD-10-CM | POA: Insufficient documentation

## 2023-03-25 DIAGNOSIS — S52201A Unspecified fracture of shaft of right ulna, initial encounter for closed fracture: Secondary | ICD-10-CM | POA: Insufficient documentation

## 2023-03-25 DIAGNOSIS — R42 Dizziness and giddiness: Secondary | ICD-10-CM | POA: Diagnosis not present

## 2023-03-25 DIAGNOSIS — S20229A Contusion of unspecified back wall of thorax, initial encounter: Secondary | ICD-10-CM | POA: Diagnosis not present

## 2023-03-25 DIAGNOSIS — W228XXA Striking against or struck by other objects, initial encounter: Secondary | ICD-10-CM | POA: Diagnosis not present

## 2023-03-25 MED ORDER — DICLOFENAC SODIUM 75 MG PO TBEC: 75.0000 mg | DELAYED_RELEASE_TABLET | Freq: Two times a day (BID) | ORAL | 0 refills | Status: AC

## 2023-03-25 NOTE — Progress Notes (Signed)
Orthopedic Tech Progress Note Patient Details:  Jesus Glass [birth year] [id number]  Ortho Devices Type of Ortho Device: Sugartong splint, Sling immobilizer Ortho Device/Splint Location: RUE Ortho Device/Splint Interventions: Ordered, Application, Adjustment   Post Interventions Patient Tolerated: Well Instructions Provided: Care of device  [name] 03/25/2023, 11:06 AM

## 2023-03-25 NOTE — ED Notes (Signed)
Ortho at bedside to put splint on

## 2023-03-25 NOTE — ED Notes (Addendum)
Patient returned from xray.
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
## 2023-03-25 NOTE — ED Notes (Signed)
Patient transported to CT 

## 2023-03-25 NOTE — Discharge Instructions (Addendum)
Return if any problems.

## 2023-04-17 DIAGNOSIS — Z5982 Transportation insecurity: Secondary | ICD-10-CM | POA: Diagnosis not present

## 2023-04-17 DIAGNOSIS — Z8249 Family history of ischemic heart disease and other diseases of the circulatory system: Secondary | ICD-10-CM | POA: Diagnosis not present

## 2023-04-17 DIAGNOSIS — Z91199 Patient's noncompliance with other medical treatment and regimen due to unspecified reason: Secondary | ICD-10-CM | POA: Diagnosis not present

## 2023-04-17 DIAGNOSIS — Z5948 Other specified lack of adequate food: Secondary | ICD-10-CM | POA: Diagnosis not present

## 2023-04-17 DIAGNOSIS — F320 Major depressive disorder, single episode, mild: Secondary | ICD-10-CM | POA: Diagnosis not present

## 2023-04-17 DIAGNOSIS — R030 Elevated blood-pressure reading, without diagnosis of hypertension: Secondary | ICD-10-CM | POA: Diagnosis not present

## 2023-04-17 DIAGNOSIS — Z809 Family history of malignant neoplasm, unspecified: Secondary | ICD-10-CM | POA: Diagnosis not present

## 2023-04-17 DIAGNOSIS — Z87891 Personal history of nicotine dependence: Secondary | ICD-10-CM | POA: Diagnosis not present

## 2023-04-17 DIAGNOSIS — F1220 Cannabis dependence, uncomplicated: Secondary | ICD-10-CM | POA: Diagnosis not present

## 2023-04-17 DIAGNOSIS — Z5986 Financial insecurity: Secondary | ICD-10-CM | POA: Diagnosis not present
